# Patient Record
Sex: Female | Born: 1950 | Race: White | Hispanic: No | Marital: Married | Smoking: Former smoker
Health system: Southern US, Community
[De-identification: ages and names within clinical notes are randomized; demographics above are authoritative.]

## PROBLEM LIST (undated history)

## (undated) DIAGNOSIS — Z87442 Personal history of urinary calculi: Secondary | ICD-10-CM

## (undated) DIAGNOSIS — R3129 Other microscopic hematuria: Secondary | ICD-10-CM

## (undated) DIAGNOSIS — I10 Essential (primary) hypertension: Secondary | ICD-10-CM

## (undated) DIAGNOSIS — E669 Obesity, unspecified: Secondary | ICD-10-CM

## (undated) DIAGNOSIS — N200 Calculus of kidney: Secondary | ICD-10-CM

## (undated) DIAGNOSIS — Z8739 Personal history of other diseases of the musculoskeletal system and connective tissue: Secondary | ICD-10-CM

## (undated) DIAGNOSIS — D219 Benign neoplasm of connective and other soft tissue, unspecified: Secondary | ICD-10-CM

## (undated) DIAGNOSIS — E119 Type 2 diabetes mellitus without complications: Secondary | ICD-10-CM

## (undated) DIAGNOSIS — H40059 Ocular hypertension, unspecified eye: Secondary | ICD-10-CM

## (undated) DIAGNOSIS — R011 Cardiac murmur, unspecified: Secondary | ICD-10-CM

## (undated) DIAGNOSIS — N920 Excessive and frequent menstruation with regular cycle: Secondary | ICD-10-CM

## (undated) DIAGNOSIS — E785 Hyperlipidemia, unspecified: Secondary | ICD-10-CM

## (undated) DIAGNOSIS — N189 Chronic kidney disease, unspecified: Secondary | ICD-10-CM

## (undated) HISTORY — DX: Other microscopic hematuria: R31.29

## (undated) HISTORY — DX: Excessive and frequent menstruation with regular cycle: N92.0

## (undated) HISTORY — DX: Essential (primary) hypertension: I10

## (undated) HISTORY — DX: Obesity, unspecified: E66.9

## (undated) HISTORY — PX: TONSILLECTOMY: SUR1361

## (undated) HISTORY — PX: DILATION AND CURETTAGE OF UTERUS: SHX78

## (undated) HISTORY — DX: Calculus of kidney: N20.0

## (undated) HISTORY — DX: Type 2 diabetes mellitus without complications: E11.9

## (undated) HISTORY — PX: ENDOMETRIAL BIOPSY: SHX622

## (undated) HISTORY — DX: Chronic kidney disease, unspecified: N18.9

## (undated) HISTORY — PX: URETEROSCOPY WITH HOLMIUM LASER LITHOTRIPSY: SHX6645

## (undated) HISTORY — DX: Ocular hypertension, unspecified eye: H40.059

## (undated) HISTORY — DX: Hyperlipidemia, unspecified: E78.5

## (undated) HISTORY — DX: Benign neoplasm of connective and other soft tissue, unspecified: D21.9

## (undated) HISTORY — DX: Personal history of other diseases of the musculoskeletal system and connective tissue: Z87.39

---

## 1998-01-14 HISTORY — PX: HYSTEROSCOPY WITH D & C: SHX1775

## 2005-05-15 ENCOUNTER — Ambulatory Visit: Payer: Self-pay | Admitting: Internal Medicine

## 2006-06-27 ENCOUNTER — Ambulatory Visit: Payer: Self-pay | Admitting: Obstetrics and Gynecology

## 2007-07-02 ENCOUNTER — Ambulatory Visit: Payer: Self-pay

## 2007-10-07 ENCOUNTER — Ambulatory Visit: Payer: Self-pay | Admitting: Physician Assistant

## 2007-12-12 HISTORY — PX: CARDIAC CATHETERIZATION: SHX172

## 2008-07-02 ENCOUNTER — Ambulatory Visit: Payer: Self-pay

## 2009-04-13 ENCOUNTER — Ambulatory Visit: Payer: Self-pay | Admitting: Cardiology

## 2009-07-06 ENCOUNTER — Ambulatory Visit: Payer: Self-pay

## 2009-07-30 ENCOUNTER — Ambulatory Visit: Payer: Self-pay | Admitting: Unknown Physician Specialty

## 2010-07-07 ENCOUNTER — Ambulatory Visit: Payer: Self-pay

## 2010-07-18 ENCOUNTER — Ambulatory Visit: Payer: Self-pay | Admitting: Internal Medicine

## 2010-08-12 ENCOUNTER — Ambulatory Visit: Payer: Self-pay | Admitting: Internal Medicine

## 2010-12-11 DIAGNOSIS — N189 Chronic kidney disease, unspecified: Secondary | ICD-10-CM

## 2010-12-11 HISTORY — DX: Chronic kidney disease, unspecified: N18.9

## 2011-05-12 ENCOUNTER — Ambulatory Visit: Payer: Self-pay | Admitting: Nephrology

## 2011-07-24 ENCOUNTER — Ambulatory Visit: Payer: Self-pay | Admitting: Nephrology

## 2011-07-25 ENCOUNTER — Ambulatory Visit: Payer: Self-pay | Admitting: Internal Medicine

## 2011-08-12 ENCOUNTER — Ambulatory Visit: Payer: Self-pay | Admitting: Nephrology

## 2012-07-31 ENCOUNTER — Ambulatory Visit: Payer: Self-pay

## 2012-09-09 ENCOUNTER — Ambulatory Visit: Payer: Self-pay | Admitting: Ophthalmology

## 2012-09-10 HISTORY — PX: CATARACT EXTRACTION: SUR2

## 2012-09-18 ENCOUNTER — Ambulatory Visit: Payer: Self-pay | Admitting: Ophthalmology

## 2013-08-13 ENCOUNTER — Ambulatory Visit: Payer: Self-pay | Admitting: Internal Medicine

## 2013-12-16 ENCOUNTER — Ambulatory Visit: Payer: Self-pay | Admitting: Internal Medicine

## 2014-08-18 ENCOUNTER — Ambulatory Visit: Payer: Self-pay | Admitting: Internal Medicine

## 2014-09-11 DIAGNOSIS — E7800 Pure hypercholesterolemia, unspecified: Secondary | ICD-10-CM | POA: Insufficient documentation

## 2014-10-15 DIAGNOSIS — Z86010 Personal history of colonic polyps: Secondary | ICD-10-CM | POA: Insufficient documentation

## 2014-12-11 HISTORY — PX: COLONOSCOPY: SHX174

## 2014-12-24 ENCOUNTER — Ambulatory Visit: Payer: Self-pay | Admitting: Unknown Physician Specialty

## 2015-03-10 ENCOUNTER — Encounter: Payer: Self-pay | Admitting: *Deleted

## 2015-03-30 NOTE — Op Note (Signed)
PATIENT NAME:  Cheryl Kirk, Cheryl Kirk MR#:  [id number] DATE OF BIRTH:  1951/02/16  DATE OF PROCEDURE:  09/18/2012  PREOPERATIVE DIAGNOSIS:  Senile cataract right eye.  POSTOPERATIVE DIAGNOSIS:  Senile cataract right eye.  PROCEDURE:  Phacoemulsification with posterior chamber intraocular lens implantation of the right eye.  LENS:  ZCB00 19.5-diopter posterior chamber intraocular lens.  ULTRASOUND TIME:  13% of 1 minute, 31 seconds. CDE 11.9.  SURGEON:  Mali Victorious Cosio, MD  ANESTHESIA:  Topical with tetracaine drops and 2% Xylocaine jelly.  COMPLICATIONS:  None.  DESCRIPTION OF PROCEDURE:  The patient was identified in the holding room and transported to the operating room and placed in the supine position under the operating microscope.  The right eye was identified as the operative eye and it was prepped and draped in the usual sterile ophthalmic fashion.  A 1 millimeter clear-corneal paracentesis was made at the 12:00 position.  The anterior chamber was filled with Viscoat viscoelastic.  A 2.4 millimeter keratome was used to make a near-clear corneal incision at the 9:00 position.  A curvilinear capsulorrhexis was made with a cystotome and capsulorrhexis forceps.  Balanced salt solution was used to hydrodissect and hydrodelineate the nucleus.  Phacoemulsification was then used in horizontal chopping fashion to remove the lens nucleus and epinucleus.  The remaining cortex was then removed using the irrigation and aspiration handpiece. Provisc was then placed into the capsular bag to distend it for lens placement.  An ZCB00 19.5-diopter lens was then injected into the capsular bag.  The remaining viscoelastic was aspirated.  Wounds were hydrated with balanced salt solution.  The anterior chamber was inflated to a physiologic pressure with balanced salt solution.  Miostat was placed into the anterior chamber to constrict the pupil.  No wound leaks were noted.  Topical Vigamox drops and Maxitrol  ointment were applied to the eye.  The patient was taken to the recovery room in stable condition without complications of anesthesia or surgery.  ____________________________ Wyonia Hough, MD crb:cms D: 09/18/2012 13:21:08 ET T: 09/18/2012 13:33:10 ET JOB#: [id number]  cc: Wyonia Hough, MD, <Dictator>  Leandrew Koyanagi MD ELECTRONICALLY SIGNED 09/18/2012 15:42

## 2015-04-20 ENCOUNTER — Other Ambulatory Visit: Payer: Self-pay

## 2015-04-20 NOTE — Patient Instructions (Addendum)
Serving Sizes What we call a serving size today is larger than it was in the past. A 1950s fast-food burger contained little more than 1 oz of meat, and a soft drink was 8 oz (1 cup). Today, a "quarter pounder" burger is at least 4 times that amount, and a 32 or 64 oz drink is not uncommon. A possible guide for eating when trying to lose weight is to eat about half as much as you normally do. Some estimates of serving sizes are:  1 Dairy serving:Individual container of yogurt (8 oz) or piece of cheese the size of your thumb (1 oz).  1 Grain serving: 1 slice of bread or  cup pasta.  1 Meat serving: The size of a deck of cards (3 oz).  1 Fruit serving: cup canned fruit or 1 medium fruit.  1 Vegetable serving:  cup of cooked or canned vegetables.  1 Fat serving:The size of 4 stacked dimes. Experts suggest spending 1 or 2 days measuring food portions you commonly eat. This will give you better practice at estimating serving sizes, and will also show whether you are eating an appropriate amount of food to meet your weight goals. If you find that you are eating more than you thought, try measuring your food for a few days so you can "reprogram" yourself to learn what makes a healthy portion for you. SUGGESTIONS FOR CONTROL  In restaurants, share entrees, or ask the waiter to put half the entre in a box or bag before you even touch it.  Order lunch-sized portions. Many restaurants serve 4 to 6 oz of meat at lunch, compared with 8 to 10 oz at dinner.  Split dessert or skip it all together. Have a piece of fruit when you get home.  At home, use smaller plates and bowls. It will look as if you are eating more.  Plate your food in the kitchen rather than serving it "family style" at the table.  Wait 20 to 30 minutes before taking seconds. This is how long it takes your brain to recognize that you are full.  Check food labels for serving sizes. Eat 1 serving only.  Use measuring cups and  spoons to see proper serving sizes.  Buy smaller packages of candy, popcorn, and snacks.  Avoid eating directly out of the bag or carton.  While eating half as much, exercise twice as much. Park further away from the mall, take the stairs instead of the escalator, and walk around your block. Losing weight is a slow, difficult process. It takes long-lasting lifestyle changes. You can make gradual changes over time so they become habits. Look to friends and family to support the healthy changes you are making. Avoid fad diets since they are often only temporary weight loss solutions. Document Released: 08/26/2003 Document Revised: 02/19/2012 Document Reviewed: 02/24/2014 ExitCare Patient Information 2015 [employer]. This information is not intended to replace advice given to you by your health care provider. Make sure you discuss any questions you have with your health care provider.  

## 2015-04-20 NOTE — Patient Outreach (Signed)
[facility]) Care Management  [place]  04/20/2015   Cheryl Kirk 09-14-1951 [id number]  Subjective: Patient states she has lost weight using the Lose It app on her phone.  She has increased her exercise and tells me she had to stop one of her BP meds (Metoprolol)in the evening and is only taking Januvia 2x/week because she's lost 16lbs and her blood sugar and blood pressure were going to low.  Objective: Blood sugars averaging 68-120mg /dl fasting and BP in the 726'O systolic as compared to higher numbers in December when she was only averaging 7000 steps per day.  She has stopped 1 of the 2 doses of Metoprolol and takes the Januvia sporadically. She is now walking 12-13,000 steps per day and is exercising on top of that.   Current Medications:  Current Outpatient Prescriptions  Medication Sig Dispense Refill  . ascorbic acid (VITAMIN C) 250 MG CHEW Chew 500 mg by mouth daily.    . calcium gluconate 500 MG tablet Take 1 tablet by mouth daily.    . cholecalciferol (VITAMIN D) 400 UNITS TABS tablet Take 400 Units by mouth daily. Vitamin d3    . glipiZIDE (GLUCOTROL XL) 10 MG 24 hr tablet Take 1 tablet by mouth daily. qam    . metFORMIN (GLUCOPHAGE) 1000 MG tablet Take 1,000 mg by mouth daily.    . metoprolol succinate (TOPROL-XL) 50 MG 24 hr tablet TAKE ONE TABLET BY MOUTH 2 TIMES A DAY    . olmesartan (BENICAR) 40 MG tablet Take 40 mg by mouth daily.    . Pitavastatin Calcium 2 MG TABS Take 1 tablet by mouth daily. qam    . sitaGLIPtin (JANUVIA) 100 MG tablet Take 100 mg by mouth daily.     No current facility-administered medications for this visit.    Functional Status:  In your present state of health, do you have any difficulty performing the following activities: 04/20/2015  Hearing? N  Vision? N  Difficulty concentrating or making decisions? N  Walking or climbing stairs? N  Dressing or bathing? N  Doing errands, shopping? N    Fall/Depression  Screening: PHQ 2/9 Scores 04/20/2015  PHQ - 2 Score 0    Assessment: Improved blood sugar and blood pressures as a result of the Lose it App that allows Cheryl Kirk to track her dietary intake.  She's thrilled with the 16 lb weight loss.  She's decreased carbs, cheese, and snacks.  Plan: Continue to measure food, wear the Jawbone, track BP, weight and blood sugars.  HgA1C- May 11, 2015 Colonoscopy- January 2016 Dilated eye scheduled May 18, 2015 Dr. Doy Hutching April 2016 Dr. Holley Raring- February 2016, scheduled May 18, 2015 Dr. Gabriel Carina- scheduled May 18, 2015 (q 6 months) Dentist- April 2016 [facility]- November 2016   [place] Problem One        Patient Outreach from 04/20/2015 in [place] Problem One  Potential for elevated A1C greater than 7%   Care Plan for Problem One  Active   [facility] Long Term Goal (31-90 days)  Maintain A1C less than 7% each time it is checked   [facility] Long Term Goal Start Date  04/20/15   Interventions for Problem One Long Term Goal  - continue to document dietary intake on Lose it   -continue to monitor exercise aiming for 10,000 steps/day  - continue to monitor Bp, weight and BP daily      Gentry Fitz, RN,  BA, [place], [place] Direct Dial:  [phone]  Fax:  [phone] E-mail: [email or web address] [address] [place], [place]  [address]

## 2015-08-04 ENCOUNTER — Other Ambulatory Visit: Payer: Self-pay | Admitting: Internal Medicine

## 2015-08-04 DIAGNOSIS — Z1231 Encounter for screening mammogram for malignant neoplasm of breast: Secondary | ICD-10-CM

## 2015-08-24 ENCOUNTER — Other Ambulatory Visit: Payer: Self-pay | Admitting: Internal Medicine

## 2015-08-24 ENCOUNTER — Ambulatory Visit
Admission: RE | Admit: 2015-08-24 | Discharge: 2015-08-24 | Disposition: A | Discharge status: Home / Self Care | Payer: 59 | Source: Ambulatory Visit | Attending: Internal Medicine | Admitting: Internal Medicine

## 2015-08-24 DIAGNOSIS — Z1231 Encounter for screening mammogram for malignant neoplasm of breast: Secondary | ICD-10-CM | POA: Diagnosis present

## 2015-10-26 ENCOUNTER — Ambulatory Visit: Payer: 59

## 2015-11-08 ENCOUNTER — Other Ambulatory Visit: Payer: Self-pay

## 2015-11-08 VITALS — BP 114/64 | Ht 63.0 in | Wt 173.4 lb

## 2015-11-08 DIAGNOSIS — E119 Type 2 diabetes mellitus without complications: Secondary | ICD-10-CM

## 2015-11-08 NOTE — Patient Outreach (Signed)
[place] [facility]) Care Management  [place]  11/08/2015   Cheryl Kirk July 29, 1951 [id number]  Subjective: Patient was in for her Link to Wellness visit. She has no complaints.    Objective: Blood sugars 90-142mg /dl over the past month. She continues to check her blood sugar every day and document her BP and her activity.  She continues to use the Lose It app on her phone and has lost about 14lbs in 2016.  She is following a low carb diet.  She has adding Turmeric to her med list.  Filed Vitals:   11/08/15 1633  BP: 114/64  Height: 1.6 m (5\' 3" )  Weight: 173 lb 6.4 oz (78.654 kg)     Current Medications:  Current Outpatient Prescriptions  Medication Sig Dispense Refill  . ascorbic acid (VITAMIN C) 250 MG CHEW Chew 500 mg by mouth daily.    . calcium gluconate 500 MG tablet Take 1 tablet by mouth daily.    . cholecalciferol (VITAMIN D) 400 UNITS TABS tablet Take 400 Units by mouth daily. Vitamin d3    . metFORMIN (GLUCOPHAGE) 1000 MG tablet Take 1,000 mg by mouth daily.    . metoprolol succinate (TOPROL-XL) 50 MG 24 hr tablet TAKE ONE TABLET BY MOUTH 2 TIMES A DAY    . Misc Natural Products (TURMERIC CURCUMIN) CAPS Take 1,500 mg by mouth daily. With Bioperine    . olmesartan (BENICAR) 40 MG tablet Take 40 mg by mouth daily.    . Pitavastatin Calcium 2 MG TABS Take 1 tablet by mouth daily. qam    . sitaGLIPtin (JANUVIA) 100 MG tablet Take 100 mg by mouth daily. Only takes 2-3 times per week- MD aware    . glipiZIDE (GLUCOTROL XL) 10 MG 24 hr tablet Take 1 tablet by mouth daily. qam     No current facility-administered medications for this visit.    Functional Status:  In your present state of health, do you have any difficulty performing the following activities: 11/08/2015 04/20/2015  Hearing? N N  Vision? N N  Difficulty concentrating or making decisions? N N  Walking or climbing stairs? N N  Dressing or bathing? N N  Doing errands, shopping? N N     Fall/Depression Screening: PHQ 2/9 Scores 11/08/2015 11/08/2015 04/20/2015  PHQ - 2 Score 0 0 0    Assessment: Well controlled blood sugars (A1C 6.5%) and BP ~ 115/63).  Follow up appointments set for Dr. Gabriel Carina.  She has completed her dilated eye, dental, PAP and flu vaccine.   Plan: Continue to check daily BP and blood sugars and daily activity.  Follow up with MDs on a regular basis.    Follow up with our team in 6 months.    Gentry Fitz, RN, BA, [place], [place] Direct Dial:  [phone]  Fax:  [phone] E-mail: [email or web address] [address] [place], [place]  [address]

## 2016-02-01 DIAGNOSIS — E119 Type 2 diabetes mellitus without complications: Secondary | ICD-10-CM | POA: Diagnosis not present

## 2016-02-01 DIAGNOSIS — I10 Essential (primary) hypertension: Secondary | ICD-10-CM | POA: Diagnosis not present

## 2016-02-01 DIAGNOSIS — N183 Chronic kidney disease, stage 3 (moderate): Secondary | ICD-10-CM | POA: Diagnosis not present

## 2016-02-07 ENCOUNTER — Other Ambulatory Visit: Payer: Self-pay

## 2016-02-07 DIAGNOSIS — E119 Type 2 diabetes mellitus without complications: Secondary | ICD-10-CM

## 2016-02-07 NOTE — Patient Outreach (Signed)
[facility]) Care Management  [place]  02/07/2016   Cheryl Kirk August 22, 1951 [id number]  Subjective: Cheryl Kirk visit- Blood sugars ideal 79-168mg /dl over the past 2 weeks- continues to check sugars daily, monitor BP, eat healthy low sodium meals and engage in regular exercise. Reports GFR has improved.  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  She continues to carry [facility] She has no complaints.  Completes dilated eye in January and dental today.  Objective:  Filed Vitals:   02/07/16 1608  BP: 101/52  Height: 1.6 m (5\' 3" )  Weight: 172 lb 3.2 oz (78.109 kg)     Current Medications:  Current Outpatient Prescriptions  Medication Sig Dispense Refill  . ascorbic acid (VITAMIN C) 250 MG CHEW Chew 500 mg by mouth daily.    . calcium gluconate 500 MG tablet Take 1 tablet by mouth daily.    . cholecalciferol (VITAMIN D) 400 UNITS TABS tablet Take 400 Units by mouth daily. Vitamin d3    . glipiZIDE (GLUCOTROL) 10 MG tablet Take 10 mg by mouth daily before breakfast.    . latanoprost (XALATAN) 0.005 % ophthalmic solution Place 1 drop into both eyes at bedtime.    . metFORMIN (GLUCOPHAGE) 1000 MG tablet Take 1,000 mg by mouth daily.    . metoprolol succinate (TOPROL-XL) 50 MG 24 hr tablet TAKE ONE TABLET BY MOUTH 2 TIMES A DAY    . Misc Natural Products (TURMERIC CURCUMIN) CAPS Take 1,500 mg by mouth daily. With Bioperine    . olmesartan (BENICAR) 40 MG tablet Take 40 mg by mouth daily.    . Pitavastatin Calcium 2 MG TABS Take 1 tablet by mouth daily. qam    . sitaGLIPtin (JANUVIA) 100 MG tablet Take 100 mg by mouth daily. Only takes 2-3 times per week- MD aware    . glipiZIDE (GLUCOTROL XL) 10 MG 24 hr tablet Take 1 tablet by mouth daily. qam     No current facility-administered medications for this visit.    Functional Status:  In your present  state of health, do you have any difficulty performing the following activities: 02/07/2016 11/08/2015  Hearing? N N  Vision? N N  Difficulty concentrating or making decisions? N N  Walking or climbing stairs? N N  Dressing or bathing? N N  Doing errands, shopping? N N    Fall/Depression Screening: PHQ 2/9 Scores 02/07/2016 02/07/2016 11/08/2015 11/08/2015 04/20/2015  PHQ - 2 Score 0 0 0 0 0    Assessment: Well managed diabetes- A1C with Dr. Gabriel Carina 6.4%. She is going to start the [place] and will continue to exercise daily despite retirement.  Plan: I have asked Cheryl Kirk to complete a HCPOA.  [facility] CM Care Plan Problem One        Most Recent Value   Care Plan Problem One  Potential for elevated A1C greater than 7%   Role Documenting the Problem One  Care Management Coordinator   Care Plan for Problem One  Active   [facility] Long Term Goal (31-90 days)  Maintain A1C less than 7% each time it is checked   [facility] Long Term Goal Start Date  02/07/16   Interventions for Problem One Long Term Goal  - continue to document dietary intake on Lose it   -continue to monitor exercise aiming for 10,000 steps   - continue to monitor  Bp, weight and BP daily

## 2016-03-14 DIAGNOSIS — I8312 Varicose veins of left lower extremity with inflammation: Secondary | ICD-10-CM | POA: Diagnosis not present

## 2016-03-14 DIAGNOSIS — I8311 Varicose veins of right lower extremity with inflammation: Secondary | ICD-10-CM | POA: Diagnosis not present

## 2016-03-14 DIAGNOSIS — X32XXXA Exposure to sunlight, initial encounter: Secondary | ICD-10-CM | POA: Diagnosis not present

## 2016-03-14 DIAGNOSIS — L821 Other seborrheic keratosis: Secondary | ICD-10-CM | POA: Diagnosis not present

## 2016-03-14 DIAGNOSIS — L570 Actinic keratosis: Secondary | ICD-10-CM | POA: Diagnosis not present

## 2016-03-29 DIAGNOSIS — N289 Disorder of kidney and ureter, unspecified: Secondary | ICD-10-CM | POA: Diagnosis not present

## 2016-03-29 DIAGNOSIS — M25551 Pain in right hip: Secondary | ICD-10-CM | POA: Diagnosis not present

## 2016-03-29 DIAGNOSIS — D509 Iron deficiency anemia, unspecified: Secondary | ICD-10-CM | POA: Diagnosis not present

## 2016-03-29 DIAGNOSIS — M533 Sacrococcygeal disorders, not elsewhere classified: Secondary | ICD-10-CM | POA: Diagnosis not present

## 2016-03-29 DIAGNOSIS — M159 Polyosteoarthritis, unspecified: Secondary | ICD-10-CM | POA: Insufficient documentation

## 2016-03-29 DIAGNOSIS — E7800 Pure hypercholesterolemia, unspecified: Secondary | ICD-10-CM | POA: Diagnosis not present

## 2016-03-29 DIAGNOSIS — I10 Essential (primary) hypertension: Secondary | ICD-10-CM | POA: Diagnosis not present

## 2016-03-29 DIAGNOSIS — M25552 Pain in left hip: Secondary | ICD-10-CM | POA: Diagnosis not present

## 2016-05-15 DIAGNOSIS — H40003 Preglaucoma, unspecified, bilateral: Secondary | ICD-10-CM | POA: Diagnosis not present

## 2016-05-17 DIAGNOSIS — Z79899 Other long term (current) drug therapy: Secondary | ICD-10-CM | POA: Diagnosis not present

## 2016-05-17 DIAGNOSIS — E119 Type 2 diabetes mellitus without complications: Secondary | ICD-10-CM | POA: Diagnosis not present

## 2016-05-17 DIAGNOSIS — E7800 Pure hypercholesterolemia, unspecified: Secondary | ICD-10-CM | POA: Diagnosis not present

## 2016-05-17 DIAGNOSIS — E559 Vitamin D deficiency, unspecified: Secondary | ICD-10-CM | POA: Diagnosis not present

## 2016-05-17 DIAGNOSIS — I10 Essential (primary) hypertension: Secondary | ICD-10-CM | POA: Diagnosis not present

## 2016-05-24 DIAGNOSIS — R5382 Chronic fatigue, unspecified: Secondary | ICD-10-CM | POA: Diagnosis not present

## 2016-05-24 DIAGNOSIS — N183 Chronic kidney disease, stage 3 (moderate): Secondary | ICD-10-CM | POA: Diagnosis not present

## 2016-05-24 DIAGNOSIS — R3129 Other microscopic hematuria: Secondary | ICD-10-CM | POA: Diagnosis not present

## 2016-05-24 DIAGNOSIS — E1122 Type 2 diabetes mellitus with diabetic chronic kidney disease: Secondary | ICD-10-CM | POA: Diagnosis not present

## 2016-06-05 DIAGNOSIS — H40053 Ocular hypertension, bilateral: Secondary | ICD-10-CM | POA: Diagnosis not present

## 2016-07-05 ENCOUNTER — Other Ambulatory Visit: Payer: Self-pay

## 2016-07-05 VITALS — BP 98/60 | Ht 63.0 in | Wt 178.4 lb

## 2016-07-05 NOTE — Patient Outreach (Signed)
[facility]) Care Management  [place]  07/05/2016   Cheryl Kirk [birth year] [id number]  Subjective: Met with Cheryl Kirk today for her Link to Wellness diabetes visit.  She has recently seen endocrinologist, family doctor and had a dilated eye exam.  She has no complaints.  Her last A1C was up slightly- she has had her Metfromin increased and now takes 1064m in the am and 5076min the pm.  She continues on Glucotrol, and Januvia. She has a very occasional episode of hypoglycemia (at work if she doesn't get a snack).  She is eating 1400 calories per day, rarely eats out , monitors her BP and steps daily.  She aims for exercise 4-5 times per week.  Objective:  Vitals:   07/05/16 1537  BP: 98/60  Weight: 178 lb 6.4 oz (80.9 kg)  Height: 1.6 m ('5\' 3"' )     Encounter Medications:  Outpatient Encounter Prescriptions as of 07/05/2016  Medication Sig Note  . ascorbic acid (VITAMIN C) 250 MG CHEW Chew 500 mg by mouth daily.   . Ascorbic Acid (VITAMIN C) 500 MG CAPS Take 1 tablet by mouth daily.   . calcium gluconate 500 MG tablet Take 1 tablet by mouth daily.   . cholecalciferol (VITAMIN D) 400 UNITS TABS tablet Take 400 Units by mouth daily. Vitamin d3   . ferrous sulfate 325 (65 FE) MG tablet Take 325 mg by mouth daily with breakfast.   . folic acid (FOLVITE) 40240CG tablet Take 400 mcg by mouth daily.   . Marland KitchenlipiZIDE (GLUCOTROL) 10 MG tablet Take 10 mg by mouth daily before breakfast.   . latanoprost (XALATAN) 0.005 % ophthalmic solution Place 1 drop into both eyes at bedtime.   . metFORMIN (GLUCOPHAGE) 1000 MG tablet Take 500 mg by mouth 2 (two) times daily. 2 in am and 1 in pm   . metoprolol succinate (TOPROL-XL) 50 MG 24 hr tablet TAKE ONE TABLET BY MOUTH 2 TIMES A DAY 11/08/2015: Generally only taking one a day  . olmesartan (BENICAR) 40 MG tablet Take 40 mg by mouth daily. 04/20/2015: [facility] BY [facility]  ONCE A DAY  . Pitavastatin Calcium 2 MG TABS Take 1 tablet by mouth daily. qam 04/20/2015: [place] [facility] [place] by mouth every night  . sitaGLIPtin (JANUVIA) 100 MG tablet Take 100 mg by mouth daily. Only takes 2-3 times per week- MD aware   . glipiZIDE (GLUCOTROL XL) 10 MG 24 hr tablet Take 1 tablet by mouth daily. qam 11/08/2015: taking  . Misc Natural Products (TURMERIC CURCUMIN) CAPS Take 1,500 mg by mouth daily. With Bioperine    No facility-administered encounter medications on file as of 07/05/2016.     Functional Status:  In your present state of health, do you have any difficulty performing the following activities: 02/07/2016 11/08/2015  Hearing? N N  Vision? N N  Difficulty concentrating or making decisions? N N  Walking or climbing stairs? N N  Dressing or bathing? N N  Doing errands, shopping? N N  Some recent data might be hidden    Fall/Depression Screening: PHQ 2/9 Scores 02/07/2016 02/07/2016 11/08/2015 11/08/2015 04/20/2015  PHQ - 2 Score 0 0 0 0 0    Assessment: Very engaged patient.  She has follow up appointments with family doctor, endocrinologist and nephrologist.   Plan:  [place]   Flowsheet Row  Most Recent Value  Care Plan Problem One  Potential for elevated A1C greater than 7%  Role Documenting the Problem One  Care Management [place] for Problem One  Active  [facility] Long Term Goal (31-90 days)  Maintain A1C less than 7% each time it is checked  [facility] Long Term Goal Start Date  07/05/16  [facility] Long Term Goal Met Date  -- [A1C 6.7% july 2017]  Interventions for Problem One Long Term Goal  Praised health habits- eating, diet monitoring, meds, exercise, healthcare follow up

## 2016-08-16 ENCOUNTER — Other Ambulatory Visit: Payer: Self-pay | Admitting: Internal Medicine

## 2016-08-16 DIAGNOSIS — Z1231 Encounter for screening mammogram for malignant neoplasm of breast: Secondary | ICD-10-CM

## 2016-08-24 DIAGNOSIS — E119 Type 2 diabetes mellitus without complications: Secondary | ICD-10-CM | POA: Diagnosis not present

## 2016-08-24 DIAGNOSIS — I10 Essential (primary) hypertension: Secondary | ICD-10-CM | POA: Diagnosis not present

## 2016-08-24 DIAGNOSIS — N183 Chronic kidney disease, stage 3 (moderate): Secondary | ICD-10-CM | POA: Diagnosis not present

## 2016-09-06 ENCOUNTER — Ambulatory Visit
Admission: RE | Admit: 2016-09-06 | Discharge: 2016-09-06 | Disposition: A | Discharge status: Home / Self Care | Payer: 59 | Source: Ambulatory Visit | Attending: Internal Medicine | Admitting: Internal Medicine

## 2016-09-06 DIAGNOSIS — Z1231 Encounter for screening mammogram for malignant neoplasm of breast: Secondary | ICD-10-CM | POA: Diagnosis not present

## 2016-09-19 ENCOUNTER — Ambulatory Visit (INDEPENDENT_AMBULATORY_CARE_PROVIDER_SITE_OTHER): Payer: 59 | Admitting: Podiatry

## 2016-09-19 ENCOUNTER — Ambulatory Visit (INDEPENDENT_AMBULATORY_CARE_PROVIDER_SITE_OTHER): Payer: 59

## 2016-09-19 ENCOUNTER — Encounter: Payer: Self-pay | Admitting: Podiatry

## 2016-09-19 VITALS — BP 105/61 | HR 78 | Resp 16 | Ht 63.0 in | Wt 172.0 lb

## 2016-09-19 DIAGNOSIS — M79671 Pain in right foot: Secondary | ICD-10-CM

## 2016-09-19 DIAGNOSIS — M79672 Pain in left foot: Secondary | ICD-10-CM | POA: Diagnosis not present

## 2016-09-19 DIAGNOSIS — L84 Corns and callosities: Secondary | ICD-10-CM | POA: Diagnosis not present

## 2016-09-19 DIAGNOSIS — L851 Acquired keratosis [keratoderma] palmaris et plantaris: Secondary | ICD-10-CM | POA: Diagnosis not present

## 2016-09-19 NOTE — Patient Instructions (Signed)
Corns and Calluses Corns are small areas of thickened skin that occur on the top, sides, or tip of a toe. They contain a cone-shaped core with a point that can press on a nerve below. This causes pain. Calluses are areas of thickened skin that can occur anywhere on the body including hands, fingers, palms, soles of the feet, and heels.Calluses are usually larger than corns.  CAUSES  Corns and calluses are caused by rubbing (friction) or pressure, such as from shoes that are too tight or do not fit properly.  RISK FACTORS Corns are more likely to develop in people who have toe deformities, such as hammer toes. Since calluses can occur with friction to any area of the skin, calluses are more likely to develop in people who:   Work with their hands.  Wear shoes that fit poorly, shoes that are too tight, or shoes that are high-heeled.  Have toes deformities. SYMPTOMS Symptoms of a corn or callus include:  A hard growth on the skin.   Pain or tenderness under the skin.   Redness and swelling.   Increased discomfort while wearing tight-fitting shoes. DIAGNOSIS  Corns and calluses may be diagnosed with a medical history and physical exam.  TREATMENT  Corns and calluses may be treated with:  Removing the cause of the friction or pressure. This may include:  Changing your shoes.  Wearing shoe inserts (orthotics) or other protective layers in your shoes, such as a corn pad.  Wearing gloves.  Medicines to help soften skin in the hardened, thickened areas.  Reducing the size of the corn or callus by removing the dead layers of skin.  Antibiotic medicines to treat infection.  Surgery, if a toe deformity is the cause. HOME CARE INSTRUCTIONS   Take medicines only as directed by your health care provider.  If you were prescribed an antibiotic, finish all of it even if you start to feel better.  Wear shoes that fit well. Avoid wearing high-heeled shoes and shoes that are too tight  or too loose.  Wear any padding, protective layers, gloves, or orthotics as directed by your health care provider.  Soak your hands or feet and then use a file or pumice stone to soften your corn or callus. Do this as directed by your health care provider.  Check your corn or callus every day for signs of infection. Watch for:  Redness, swelling, or pain.  Fluid, blood, or pus. SEEK MEDICAL CARE IF:   Your symptoms do not improve with treatment.  You have increased redness, swelling, or pain at the site of your corn or callus.  You have fluid, blood, or pus coming from your corn or callus.  You have new symptoms.   This information is not intended to replace advice given to you by your health care provider. Make sure you discuss any questions you have with your health care provider.   Document Released: 09/02/2004 Document Revised: 04/13/2015 Document Reviewed: 11/23/2014 Elsevier Interactive Patient Education 2016 Elsevier Inc.  

## 2016-09-19 NOTE — Progress Notes (Signed)
   Subjective:    Patient ID: Cheryl Kirk, female    DOB: [birth year], 65 y.o.   MRN: [id number]  HPI    Review of Systems  Hematological: Bruises/bleeds easily.  All other systems reviewed and are negative.      Objective:   Physical Exam        Assessment & Plan:

## 2016-09-20 NOTE — Progress Notes (Signed)
Patient ID: Cheryl Kirk, female   DOB: 1951-10-11, 65 y.o.   MRN: [id number] Subjective: Patient presents to the office today for chief complaint of painful callus lesions of the feet. Patient states that the pain is ongoing and is affecting their ability to ambulate without pain. Patient presents today for further treatment and evaluation. Patient also presents with pain and tenderness to the fifth metatarsal tuberosity left foot.  Objective:  Physical Exam General: Alert and oriented x3 in no acute distress  Dermatology: Hyperkeratotic lesion present on the plantar aspect of the fourth and fifth MPJs bilaterally. Pain on palpation with a central nucleated core noted.  Skin is warm, dry and supple bilateral lower extremities. Negative for open lesions or macerations.  Vascular: Palpable pedal pulses bilaterally. No edema or erythema noted. Capillary refill within normal limits.  Neurological: Epicritic and protective threshold grossly intact bilaterally.   Musculoskeletal Exam: Pain on palpation at the keratotic lesion noted. Range of motion within normal limits bilateral. Muscle strength 5/5 in all groups bilateral.  Radiographic exam: Enlargement of the fifth metatarsal tuberosity to the left foot noted. Possibly due to history of trauma. All other joints in osseous structures within normal limits.  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  #3 Treated area(s) with Salinocaine and dressed with light dressing. #4 recommend wider shoe gear and conservative modalities to alleviate fifth metatarsal tuberosity symptoms. #5 metatarsal pads dispensed for the patient to try. If the patient likes them than recommend custom molded orthotics with  metatarsal pads built in #6 Patient is to return to the clinic PRN.   Edrick Kins, [place]

## 2016-09-27 DIAGNOSIS — Z124 Encounter for screening for malignant neoplasm of cervix: Secondary | ICD-10-CM | POA: Diagnosis not present

## 2016-09-27 DIAGNOSIS — Z01419 Encounter for gynecological examination (general) (routine) without abnormal findings: Secondary | ICD-10-CM | POA: Diagnosis not present

## 2016-09-28 DIAGNOSIS — I10 Essential (primary) hypertension: Secondary | ICD-10-CM | POA: Diagnosis not present

## 2016-09-28 DIAGNOSIS — Z23 Encounter for immunization: Secondary | ICD-10-CM | POA: Diagnosis not present

## 2016-09-28 DIAGNOSIS — D509 Iron deficiency anemia, unspecified: Secondary | ICD-10-CM | POA: Diagnosis not present

## 2016-09-28 DIAGNOSIS — E7800 Pure hypercholesterolemia, unspecified: Secondary | ICD-10-CM | POA: Diagnosis not present

## 2016-09-28 DIAGNOSIS — Z0000 Encounter for general adult medical examination without abnormal findings: Secondary | ICD-10-CM | POA: Diagnosis not present

## 2016-10-26 ENCOUNTER — Ambulatory Visit: Payer: 59 | Admitting: Urology

## 2016-10-26 ENCOUNTER — Encounter: Payer: Self-pay | Admitting: Urology

## 2016-10-26 VITALS — BP 113/65 | HR 69 | Ht 63.0 in | Wt 175.0 lb

## 2016-10-26 DIAGNOSIS — R739 Hyperglycemia, unspecified: Secondary | ICD-10-CM | POA: Insufficient documentation

## 2016-10-26 DIAGNOSIS — D509 Iron deficiency anemia, unspecified: Secondary | ICD-10-CM | POA: Insufficient documentation

## 2016-10-26 DIAGNOSIS — N289 Disorder of kidney and ureter, unspecified: Secondary | ICD-10-CM | POA: Insufficient documentation

## 2016-10-26 DIAGNOSIS — R3129 Other microscopic hematuria: Secondary | ICD-10-CM

## 2016-10-26 DIAGNOSIS — I10 Essential (primary) hypertension: Secondary | ICD-10-CM | POA: Insufficient documentation

## 2016-10-26 DIAGNOSIS — N920 Excessive and frequent menstruation with regular cycle: Secondary | ICD-10-CM | POA: Insufficient documentation

## 2016-10-26 DIAGNOSIS — G4762 Sleep related leg cramps: Secondary | ICD-10-CM | POA: Insufficient documentation

## 2016-10-26 DIAGNOSIS — E1121 Type 2 diabetes mellitus with diabetic nephropathy: Secondary | ICD-10-CM | POA: Insufficient documentation

## 2016-10-26 DIAGNOSIS — N200 Calculus of kidney: Secondary | ICD-10-CM | POA: Insufficient documentation

## 2016-10-26 DIAGNOSIS — R000 Tachycardia, unspecified: Secondary | ICD-10-CM | POA: Insufficient documentation

## 2016-10-26 DIAGNOSIS — R002 Palpitations: Secondary | ICD-10-CM | POA: Insufficient documentation

## 2016-10-26 LAB — MICROSCOPIC EXAMINATION: Bacteria, UA: NONE SEEN

## 2016-10-26 LAB — URINALYSIS, COMPLETE
Bilirubin, UA: NEGATIVE
GLUCOSE, UA: NEGATIVE
KETONES UA: NEGATIVE
Leukocytes, UA: NEGATIVE
NITRITE UA: NEGATIVE
PROTEIN UA: NEGATIVE
UUROB: 0.2 mg/dL (ref 0.2–1.0)
pH, UA: 6 (ref 5.0–7.5)

## 2016-10-26 NOTE — Progress Notes (Signed)
10/26/2016 11:59 AM   Cheryl Kirk 1951/02/15 [id number]  Referring provider: Idelle Crouch, MD [facility] [place], [place] [address]  Chief Complaint  Patient presents with  . Hematuria    New Patient    HPI: 65 yo F referred by Dr. Lane Hacker for history of microscopic hematuria.    She notes that this is been going on for several years.  She did have a urinalysis by Dr. Doy Hutching in June 2017 which was demonstrated today again in our off in the absence of evidence of infection.  No gross hematuria.  No issues what UTI, urgency, frequency, leakage or dysuria.    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    Personal history of kidney stones.  She underwent ureteroscopy with Dr. Bernardo Heater 15 years ago.  No flank pain since that time.    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     No family history of bladder, kidney, or prostate cancer.     PMH: Past Medical History:  Diagnosis Date  . Chronic kidney disease   . Diabetes mellitus without complication ([place])   . Hyperlipidemia   . Hypertension    BP and occular  . Kidney stones   . Obesity     Surgical History: Past Surgical History:  Procedure Laterality Date  . CATARACT EXTRACTION Right October 2013  . DILATION AND CURETTAGE OF UTERUS    . TONSILLECTOMY    . URETEROSCOPY WITH HOLMIUM LASER LITHOTRIPSY      Home Medications:    Medication List       Accurate as of 10/26/16 11:59 AM. Always use your most recent med list.          cholecalciferol 400 units Tabs tablet Commonly known as:  VITAMIN D Take 400 Units by mouth daily. Vitamin d3   glipiZIDE 10 MG tablet Commonly known as:  GLUCOTROL Take 10 mg by mouth daily before breakfast.   latanoprost 0.005 % ophthalmic solution Commonly known as:  XALATAN Place 1 drop into both eyes at bedtime.   metFORMIN 1000 MG tablet Commonly known as:  GLUCOPHAGE Take 500 mg by mouth 2 (two)  times daily. 2 in am and 1 in pm   metoprolol succinate 50 MG 24 hr tablet Commonly known as:  TOPROL-XL TAKE ONE TABLET BY MOUTH 2 TIMES A DAY   Pitavastatin Calcium 2 MG Tabs Take 1 tablet by mouth daily. qam   sitaGLIPtin 100 MG tablet Commonly known as:  JANUVIA Take 100 mg by mouth daily. Only takes 2-3 times per week- MD aware   Vitamin C 500 MG Caps Take 1 tablet by mouth daily.       Allergies: No Known Allergies  Family History: Family History  Problem Relation Age of Onset  . Diabetes Mother   . Hypertension Mother   . Diabetes Father   . Heart disease Father   . Diabetes Sister   . Hypertension Sister   . Diabetes Brother   . Hypertension Brother   . Hypertension Sister   . Bladder Cancer Neg Hx   . Prostate cancer Neg Hx   . Kidney cancer Neg Hx     Social History:  reports that she quit smoking about 44 years ago. She quit after 1.50 years of use. She has never used smokeless tobacco. She reports that she drinks alcohol. She reports that she does not use drugs.  ROS: UROLOGY Frequent  Urination?: No Hard to postpone urination?: No Burning/pain with urination?: No Get up at night to urinate?: No Leakage of urine?: No Urine stream starts and stops?: No Trouble starting stream?: No Do you have to strain to urinate?: No Blood in urine?: Yes Urinary tract infection?: No Sexually transmitted disease?: No Injury to kidneys or bladder?: No Painful intercourse?: No Weak stream?: No Currently pregnant?: No Vaginal bleeding?: No Last menstrual period?: n  Gastrointestinal Nausea?: No Vomiting?: No Indigestion/heartburn?: No Diarrhea?: No Constipation?: No  Constitutional Fever: No Night sweats?: No Weight loss?: No Fatigue?: No  Skin Skin rash/lesions?: No Itching?: No  Eyes Blurred vision?: No Double vision?: No  Ears/Nose/Throat Sore throat?: No Sinus problems?: No  Hematologic/Lymphatic Swollen glands?: No Easy bruising?:  Yes  Cardiovascular Leg swelling?: No Chest pain?: No  Respiratory Cough?: No Shortness of breath?: No  Endocrine Excessive thirst?: No  Musculoskeletal Back pain?: No Joint pain?: No  Neurological Headaches?: No Dizziness?: No  Psychologic Depression?: No Anxiety?: No  Physical Exam: BP 113/65   Pulse 69   Ht 5\' 3"  (1.6 m)   Wt 175 lb (79.4 kg)   BMI 31.00 kg/m   Constitutional:  Alert and oriented, No acute distress. HEENT: [place] AT, moist mucus membranes.  Trachea midline, no masses. Cardiovascular: No clubbing, cyanosis.  Mild left lower extremity edema which is chronic. Respiratory: Normal respiratory effort, no increased work of breathing. GI: Abdomen is soft, nontender, nondistended, no abdominal masses GU: No CVA tenderness. Skin: No rashes, bruises or suspicious lesions. Lymph: No cervical or inguinal adenopathy. Neurologic: Grossly intact, no focal deficits, moving all 4 extremities. Psychiatric: Normal mood and affect.  Laboratory Data: Creatinine 1.0 on 05/17/2016   Urinalysis UA today in our office with 4-10 red blood cells per high-powered field, otherwise negative. See Epic.  Pertinent Imaging: No previous recent cross-sectional imaging  Assessment & Plan:    1. Microscopic hematuria We discussed the differential diagnosis for microscopic hematuria including nephrolithiasis, renal or upper tract tumors, bladder stones, UTIs, or bladder tumors as well as undetermined etiologies. Per AUA guidelines, I did recommend complete microscopic hematuria evaluation including CTU, possible urine cytology, and office cystoscopy. - Urinalysis, Complete - CT HEMATURIA WORKUP; Future  2. Renal insufficiency Cr 1.0, OK for IV contrast with hydration, hold metformin  Return in about 4 weeks (around 11/23/2016) for cystoscopy, f/u CT urogram.  Hollice Espy, MD  [facility] [address] [place], [place]  [id number] [phone]

## 2016-11-16 DIAGNOSIS — E1122 Type 2 diabetes mellitus with diabetic chronic kidney disease: Secondary | ICD-10-CM | POA: Diagnosis not present

## 2016-11-16 DIAGNOSIS — N183 Chronic kidney disease, stage 3 (moderate): Secondary | ICD-10-CM | POA: Diagnosis not present

## 2016-11-16 DIAGNOSIS — E7800 Pure hypercholesterolemia, unspecified: Secondary | ICD-10-CM | POA: Diagnosis not present

## 2016-11-16 DIAGNOSIS — Z79899 Other long term (current) drug therapy: Secondary | ICD-10-CM | POA: Diagnosis not present

## 2016-11-16 DIAGNOSIS — R319 Hematuria, unspecified: Secondary | ICD-10-CM | POA: Diagnosis not present

## 2016-11-24 DIAGNOSIS — N183 Chronic kidney disease, stage 3 (moderate): Secondary | ICD-10-CM | POA: Diagnosis not present

## 2016-11-24 DIAGNOSIS — E1122 Type 2 diabetes mellitus with diabetic chronic kidney disease: Secondary | ICD-10-CM | POA: Diagnosis not present

## 2016-11-28 DIAGNOSIS — H40003 Preglaucoma, unspecified, bilateral: Secondary | ICD-10-CM | POA: Diagnosis not present

## 2016-12-14 ENCOUNTER — Ambulatory Visit
Admission: RE | Admit: 2016-12-14 | Discharge: 2016-12-14 | Disposition: A | Discharge status: Home / Self Care | Payer: Medicare Other | Source: Ambulatory Visit | Attending: Urology | Admitting: Urology

## 2016-12-14 DIAGNOSIS — I700 Atherosclerosis of aorta: Secondary | ICD-10-CM | POA: Insufficient documentation

## 2016-12-14 DIAGNOSIS — R3129 Other microscopic hematuria: Secondary | ICD-10-CM | POA: Insufficient documentation

## 2016-12-14 MED ORDER — IOPAMIDOL (ISOVUE-300) INJECTION 61%
125.0000 mL | Freq: Once | INTRAVENOUS | Status: AC | PRN
  Administered 2016-12-14: 125 mL via INTRAVENOUS

## 2016-12-20 ENCOUNTER — Encounter: Payer: Self-pay | Admitting: Urology

## 2016-12-20 ENCOUNTER — Ambulatory Visit (INDEPENDENT_AMBULATORY_CARE_PROVIDER_SITE_OTHER): Payer: Medicare Other | Admitting: Urology

## 2016-12-20 ENCOUNTER — Telehealth: Payer: Self-pay | Admitting: Radiology

## 2016-12-20 VITALS — BP 101/66 | HR 68 | Ht 63.0 in | Wt 174.0 lb

## 2016-12-20 DIAGNOSIS — R3129 Other microscopic hematuria: Secondary | ICD-10-CM | POA: Diagnosis not present

## 2016-12-20 DIAGNOSIS — N329 Bladder disorder, unspecified: Secondary | ICD-10-CM

## 2016-12-20 DIAGNOSIS — N289 Disorder of kidney and ureter, unspecified: Secondary | ICD-10-CM

## 2016-12-20 LAB — URINALYSIS, COMPLETE
Bilirubin, UA: NEGATIVE
Glucose, UA: NEGATIVE
Ketones, UA: NEGATIVE
LEUKOCYTES UA: NEGATIVE
Nitrite, UA: NEGATIVE
PH UA: 5 (ref 5.0–7.5)
PROTEIN UA: NEGATIVE
Specific Gravity, UA: 1.02 (ref 1.005–1.030)
Urobilinogen, Ur: 0.2 mg/dL (ref 0.2–1.0)

## 2016-12-20 LAB — MICROSCOPIC EXAMINATION

## 2016-12-20 MED ORDER — CIPROFLOXACIN HCL 500 MG PO TABS
500.0000 mg | ORAL_TABLET | Freq: Once | ORAL | Status: AC
  Administered 2016-12-20: 500 mg via ORAL

## 2016-12-20 MED ORDER — LIDOCAINE HCL 2 % EX GEL
1.0000 "application " | Freq: Once | CUTANEOUS | Status: AC
  Administered 2016-12-20: 1 via URETHRAL

## 2016-12-20 NOTE — Telephone Encounter (Signed)
Notified pt of surgery scheduled with Dr Erlene Quan on 01/02/17, pre-admit testing appt on 12/26/16 @10 :30 & to call day prior to surgery for arrival time to SDS. Pt voices understanding.

## 2016-12-20 NOTE — Progress Notes (Signed)
12/20/16  CC:  Chief Complaint  Patient presents with  . Cysto    HPI: 66 yo F referred by Dr. Lane Hacker for history of microscopic hematuria.  She returns to the office today to complete her microscopic hematuria workup.    CT urogram on 12/14/2016 negative for any GU pathology. Incidental moderate atheroclerosis within the aorta identified.  She notes that this is been going on for several years.  She did have a urinalysis by Dr. Doy Hutching in June 2017 which was demonstrated today again in our off in the absence of evidence of infection.  No gross hematuria.  No issues what UTI, urgency, frequency, leakage or dysuria.    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    Personal history of kidney stones.  She underwent ureteroscopy with Dr. Bernardo Heater 15 years ago.  No flank pain since that time.    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     Past Medical History:  Diagnosis Date  . Chronic kidney disease   . Diabetes mellitus without complication ([place])   . Hyperlipidemia   . Hypertension    BP and occular  . Kidney stones   . Obesity    Past Surgical History:  Procedure Laterality Date  . CATARACT EXTRACTION Right October 2013  . DILATION AND CURETTAGE OF UTERUS    . TONSILLECTOMY    . URETEROSCOPY WITH HOLMIUM LASER LITHOTRIPSY     No Known Allergies  Current Meds  Medication Sig  . latanoprost (XALATAN) 0.005 % ophthalmic solution Place 1 drop into both eyes at bedtime.  . metoprolol succinate (TOPROL-XL) 50 MG 24 hr tablet Take 50 mg by mouth daily.   . [DISCONTINUED] Ascorbic Acid (VITAMIN C) 500 MG CAPS Take 1 tablet by mouth daily.  . [DISCONTINUED] cholecalciferol (VITAMIN D) 400 UNITS TABS tablet Take 400 Units by mouth daily. Vitamin d3  . [DISCONTINUED] glipiZIDE (GLUCOTROL) 10 MG tablet Take 10 mg by mouth daily before breakfast.  . [DISCONTINUED] metFORMIN (GLUCOPHAGE) 1000 MG tablet Take 500 mg by mouth 2 (two) times  daily. 2 in am and 1 in pm  . [DISCONTINUED] Pitavastatin Calcium 2 MG TABS Take 1 tablet by mouth daily. qam  . [DISCONTINUED] sitaGLIPtin (JANUVIA) 100 MG tablet Take 100 mg by mouth daily. Only takes 2-3 times per week- MD aware     Vitals:   12/20/16 0939  BP: 101/66  Pulse: 68   NED. A&Ox3.   No respiratory distress   Abd soft, NT, ND Normal external genitalia with patent urethral meatus  CT urogram personally reviewed today  12/14/16 IMPRESSION: 1. No CT findings to account for the patient's microhematuria. No renal, ureteral or bladder calculi or mass. 2. No acute abdominal/pelvic findings, mass lesions or adenopathy. 3. Moderate atherosclerotic calcifications involving the aorta branch vessels.   Electronically Signed   By: Marijo Sanes M.D.   On: 12/14/2016 10:42  Cystoscopy Procedure Note  Patient identification was confirmed, informed consent was obtained, and patient was prepped using Betadine solution.  Lidocaine jelly was administered per urethral meatus.    Preoperative abx where received prior to procedure.    Procedure: - Flexible cystoscope introduced, without any difficulty.   - Thorough search of the bladder revealed:    normal urethral meatus    Subtle papillary/frondular changes within the proximal urethra extending just within the bladder neck.    normal urothelium    no stones    no ulcers  no tumors    no urethral polyps    no trabeculation  - Ureteral orifices were normal in position and appearance.  Retroflexion showed fine fronds just at the bladder neck, <5 mm  Post-Procedure: - Patient tolerated the procedure well  Assessment/ Plan:  1. Microscopic hematuria S/p cystoscopy/ CT urogram Changes as noted above on cystoscopy - Urinalysis, Complete - ciprofloxacin (CIPRO) tablet 500 mg; Take 1 tablet (500 mg total) by mouth once. - lidocaine (XYLOCAINE) 2 % jelly 1 application; Place 1 application into the urethra once.  2.  Lesion of bladder Very small subtle changes involving bladder neck and proximal urethra.  I suspect these are likely benign, however, I do recommend biopsy to rule out any malignancy Risk and benefits of bladder/proximal urethral biopsy discussed in detail. These include risk of incontinence, dysuria, gross hematuria, infection, damage to surrounding structures, urinary retention, need for Foley catheterization possibly amongst others. All of her questions were answered.  She is willing to proceed as planned.  3. Renal insufficiency Stable  Schedule surgery as above  Hollice Espy, MD

## 2016-12-26 ENCOUNTER — Other Ambulatory Visit: Payer: Self-pay | Admitting: Radiology

## 2016-12-26 ENCOUNTER — Encounter
Admission: RE | Admit: 2016-12-26 | Discharge: 2016-12-26 | Disposition: A | Discharge status: Home / Self Care | Payer: Medicare Other | Source: Ambulatory Visit | Attending: Urology | Admitting: Urology

## 2016-12-26 DIAGNOSIS — E119 Type 2 diabetes mellitus without complications: Secondary | ICD-10-CM | POA: Diagnosis not present

## 2016-12-26 DIAGNOSIS — I10 Essential (primary) hypertension: Secondary | ICD-10-CM | POA: Insufficient documentation

## 2016-12-26 DIAGNOSIS — N329 Bladder disorder, unspecified: Secondary | ICD-10-CM

## 2016-12-26 NOTE — Patient Instructions (Signed)
Your procedure is scheduled on: Tuesday 01/02/17 Report to [facility]. 2ND FLOOR MEDICAL MALL ENTRANCE. To find out your arrival time please call [phone] between 1PM - 3PM on Monday 01/01/17.  Remember: Instructions that are not followed completely may result in serious medical risk, up to and including death, or upon the discretion of your surgeon and anesthesiologist your surgery may need to be rescheduled.    __X__ 1. Do not eat food or drink liquids after midnight. No gum chewing or hard candies.     __X__ 2. No Alcohol for 24 hours before or after surgery.   ____ 3. Bring all medications with you on the day of surgery if instructed.    __X__ 4. Notify your doctor if there is any change in your medical condition     (cold, fever, infections).             ___X__5. No smoking within 24 hours of your surgery.     Do not wear jewelry, make-up, hairpins, clips or nail polish.  Do not wear lotions, powders, or perfumes.   Do not shave 48 hours prior to surgery. Men may shave face and neck.  Do not bring valuables to the hospital.    [facility] is not responsible for any belongings or valuables.               Contacts, dentures or bridgework may not be worn into surgery.  Leave your suitcase in the car. After surgery it may be brought to your room.  For patients admitted to the hospital, discharge time is determined by your                treatment team.   Patients discharged the day of surgery will not be allowed to drive home.   Please read over the following fact sheets that you were given:   Pain Booklet and MRSA Information   __X__ Take these medicines the morning of surgery with A SIP OF WATER:    1. METOPROLOL  2. OLMESARTAN  3. PITAVASTATIN  4.  5.  6.  ____ Fleet Enema (as directed)   ____ Use CHG Soap as directed  ____ Use inhalers on the day of surgery  ____ Stop metformin 2 days prior to surgery    ____ Take 1/2 of usual insulin dose the night before  surgery and none on the morning of surgery.   ____ Stop Coumadin/Plavix/aspirin on   __X__ Stop Anti-inflammatories such as Advil, Aleve, Ibuprofen, Motrin, Naproxen, Naprosyn, Goodies,powder, or aspirin products.  OK to take Tylenol.   ____ Stop supplements until after surgery.    ____ Bring C-Pap to the hospital.

## 2016-12-26 NOTE — Pre-Procedure Instructions (Signed)
REQUEST FOR MEDICAL CLEARANCE / EKG, AS INSTRUCTED BY DR Purple Sage FAXED TO DR Doy Hutching AND DR Cherrie Gauze OFFICE. NOTIFIED PATIENT.SPOKE WITH CHARLENE AT DR [facility] OFFICE

## 2016-12-27 LAB — URINE CULTURE: Culture: NO GROWTH

## 2016-12-27 NOTE — Pre-Procedure Instructions (Signed)
CLEARED BY DR Doy Hutching 12/26/16 LOW RISK

## 2017-01-01 MED ORDER — CEFAZOLIN IN D5W 1 GM/50ML IV SOLN
1.0000 g | INTRAVENOUS | Status: AC
  Administered 2017-01-02: 1 g via INTRAVENOUS

## 2017-01-02 ENCOUNTER — Encounter: Payer: Self-pay | Admitting: *Deleted

## 2017-01-02 ENCOUNTER — Ambulatory Visit: Payer: Medicare Other | Admitting: Anesthesiology

## 2017-01-02 ENCOUNTER — Encounter
Admission: RE | Disposition: A | Discharge status: Home / Self Care | Payer: Self-pay | Source: Ambulatory Visit | Attending: Urology

## 2017-01-02 ENCOUNTER — Ambulatory Visit
Admission: RE | Admit: 2017-01-02 | Discharge: 2017-01-02 | Disposition: A | Discharge status: Home / Self Care | Payer: Medicare Other | Source: Ambulatory Visit | Attending: Urology | Admitting: Urology

## 2017-01-02 DIAGNOSIS — N189 Chronic kidney disease, unspecified: Secondary | ICD-10-CM | POA: Insufficient documentation

## 2017-01-02 DIAGNOSIS — I129 Hypertensive chronic kidney disease with stage 1 through stage 4 chronic kidney disease, or unspecified chronic kidney disease: Secondary | ICD-10-CM | POA: Diagnosis not present

## 2017-01-02 DIAGNOSIS — Z79899 Other long term (current) drug therapy: Secondary | ICD-10-CM | POA: Diagnosis not present

## 2017-01-02 DIAGNOSIS — Z87891 Personal history of nicotine dependence: Secondary | ICD-10-CM | POA: Diagnosis not present

## 2017-01-02 DIAGNOSIS — R311 Benign essential microscopic hematuria: Secondary | ICD-10-CM | POA: Diagnosis not present

## 2017-01-02 DIAGNOSIS — N3081 Other cystitis with hematuria: Secondary | ICD-10-CM | POA: Diagnosis not present

## 2017-01-02 DIAGNOSIS — E1122 Type 2 diabetes mellitus with diabetic chronic kidney disease: Secondary | ICD-10-CM | POA: Diagnosis not present

## 2017-01-02 DIAGNOSIS — Z87442 Personal history of urinary calculi: Secondary | ICD-10-CM | POA: Diagnosis not present

## 2017-01-02 DIAGNOSIS — E119 Type 2 diabetes mellitus without complications: Secondary | ICD-10-CM | POA: Diagnosis not present

## 2017-01-02 DIAGNOSIS — Z6830 Body mass index (BMI) 30.0-30.9, adult: Secondary | ICD-10-CM | POA: Diagnosis not present

## 2017-01-02 DIAGNOSIS — N329 Bladder disorder, unspecified: Secondary | ICD-10-CM | POA: Insufficient documentation

## 2017-01-02 DIAGNOSIS — D494 Neoplasm of unspecified behavior of bladder: Secondary | ICD-10-CM | POA: Diagnosis not present

## 2017-01-02 DIAGNOSIS — E669 Obesity, unspecified: Secondary | ICD-10-CM | POA: Diagnosis not present

## 2017-01-02 DIAGNOSIS — E785 Hyperlipidemia, unspecified: Secondary | ICD-10-CM | POA: Insufficient documentation

## 2017-01-02 DIAGNOSIS — R3129 Other microscopic hematuria: Secondary | ICD-10-CM | POA: Diagnosis not present

## 2017-01-02 DIAGNOSIS — Z7984 Long term (current) use of oral hypoglycemic drugs: Secondary | ICD-10-CM | POA: Diagnosis not present

## 2017-01-02 HISTORY — PX: CYSTOSCOPY WITH BIOPSY: SHX5122

## 2017-01-02 HISTORY — DX: Personal history of urinary calculi: Z87.442

## 2017-01-02 HISTORY — DX: Cardiac murmur, unspecified: R01.1

## 2017-01-02 LAB — GLUCOSE, CAPILLARY
GLUCOSE-CAPILLARY: 177 mg/dL — AB (ref 65–99)
GLUCOSE-CAPILLARY: 136 mg/dL — AB (ref 65–99)

## 2017-01-02 SURGERY — CYSTOSCOPY, WITH BIOPSY
Anesthesia: General | Site: Bladder | Wound class: Clean Contaminated

## 2017-01-02 MED ORDER — PROPOFOL 10 MG/ML IV BOLUS
INTRAVENOUS | Status: AC
  Filled 2017-01-02: qty 20

## 2017-01-02 MED ORDER — PHENAZOPYRIDINE HCL 200 MG PO TABS: 200.0000 mg | ORAL_TABLET | Freq: Three times a day (TID) | ORAL | 0 refills | Status: DC | PRN

## 2017-01-02 MED ORDER — PROPOFOL 10 MG/ML IV BOLUS
INTRAVENOUS | Status: DC | PRN
  Administered 2017-01-02: 50 mg via INTRAVENOUS
  Administered 2017-01-02 (×2): 10 mg via INTRAVENOUS

## 2017-01-02 MED ORDER — ONDANSETRON HCL 4 MG/2ML IJ SOLN
INTRAMUSCULAR | Status: AC
  Filled 2017-01-02: qty 2

## 2017-01-02 MED ORDER — PHENYLEPHRINE HCL 10 MG/ML IJ SOLN
INTRAMUSCULAR | Status: DC | PRN
  Administered 2017-01-02 (×2): 80 ug via INTRAVENOUS
  Administered 2017-01-02: 40 ug via INTRAVENOUS
  Administered 2017-01-02: 80 ug via INTRAVENOUS

## 2017-01-02 MED ORDER — MIDAZOLAM HCL 2 MG/2ML IJ SOLN
INTRAMUSCULAR | Status: AC
  Filled 2017-01-02: qty 2

## 2017-01-02 MED ORDER — DEXAMETHASONE SODIUM PHOSPHATE 10 MG/ML IJ SOLN
INTRAMUSCULAR | Status: AC
  Filled 2017-01-02: qty 1

## 2017-01-02 MED ORDER — KETOROLAC TROMETHAMINE 30 MG/ML IJ SOLN
INTRAMUSCULAR | Status: DC | PRN
  Administered 2017-01-02: 30 mg via INTRAVENOUS

## 2017-01-02 MED ORDER — DOCUSATE SODIUM 100 MG PO CAPS: 100.0000 mg | ORAL_CAPSULE | Freq: Two times a day (BID) | ORAL | 0 refills | Status: DC

## 2017-01-02 MED ORDER — PHENYLEPHRINE 40 MCG/ML (10ML) SYRINGE FOR IV PUSH (FOR BLOOD PRESSURE SUPPORT)
PREFILLED_SYRINGE | INTRAVENOUS | Status: AC
  Filled 2017-01-02: qty 10

## 2017-01-02 MED ORDER — MIDAZOLAM HCL 2 MG/2ML IJ SOLN
INTRAMUSCULAR | Status: DC | PRN
  Administered 2017-01-02: 2 mg via INTRAVENOUS

## 2017-01-02 MED ORDER — FENTANYL CITRATE (PF) 100 MCG/2ML IJ SOLN: 25.0000 ug | INTRAMUSCULAR | Status: DC | PRN

## 2017-01-02 MED ORDER — FENTANYL CITRATE (PF) 100 MCG/2ML IJ SOLN
INTRAMUSCULAR | Status: AC
  Filled 2017-01-02: qty 2

## 2017-01-02 MED ORDER — FAMOTIDINE 20 MG PO TABS
ORAL_TABLET | ORAL | Status: AC
  Administered 2017-01-02: 20 mg via ORAL
  Filled 2017-01-02: qty 1

## 2017-01-02 MED ORDER — SODIUM CHLORIDE 0.9 % IV SOLN
INTRAVENOUS | Status: DC
  Administered 2017-01-02: 11:00:00 via INTRAVENOUS

## 2017-01-02 MED ORDER — ONDANSETRON HCL 4 MG/2ML IJ SOLN
INTRAMUSCULAR | Status: DC | PRN
  Administered 2017-01-02: 4 mg via INTRAVENOUS

## 2017-01-02 MED ORDER — FENTANYL CITRATE (PF) 100 MCG/2ML IJ SOLN
INTRAMUSCULAR | Status: DC | PRN
  Administered 2017-01-02 (×2): 50 ug via INTRAVENOUS

## 2017-01-02 MED ORDER — PROPOFOL 500 MG/50ML IV EMUL
INTRAVENOUS | Status: DC | PRN
  Administered 2017-01-02: 50 ug/kg/min via INTRAVENOUS

## 2017-01-02 MED ORDER — FAMOTIDINE 20 MG PO TABS
20.0000 mg | ORAL_TABLET | Freq: Once | ORAL | Status: AC
  Administered 2017-01-02: 20 mg via ORAL

## 2017-01-02 MED ORDER — CEFAZOLIN IN D5W 1 GM/50ML IV SOLN
INTRAVENOUS | Status: AC
Start: 2017-01-02 — End: 2017-01-02
  Administered 2017-01-02: 1 g via INTRAVENOUS
  Filled 2017-01-02: qty 50

## 2017-01-02 MED ORDER — OXYBUTYNIN CHLORIDE 5 MG PO TABS: 5.0000 mg | ORAL_TABLET | Freq: Three times a day (TID) | ORAL | 0 refills | Status: DC | PRN

## 2017-01-02 MED ORDER — ONDANSETRON HCL 4 MG/2ML IJ SOLN: 4.0000 mg | Freq: Once | INTRAMUSCULAR | Status: DC | PRN

## 2017-01-02 MED ORDER — KETOROLAC TROMETHAMINE 30 MG/ML IJ SOLN
INTRAMUSCULAR | Status: AC
  Filled 2017-01-02: qty 1

## 2017-01-02 MED ORDER — DEXAMETHASONE SODIUM PHOSPHATE 10 MG/ML IJ SOLN
INTRAMUSCULAR | Status: DC | PRN
  Administered 2017-01-02: 10 mg via INTRAVENOUS

## 2017-01-02 SURGICAL SUPPLY — 16 items
BAG DRAIN CYSTO-URO LG1000N (MISCELLANEOUS) ×2 IMPLANT
DRESSING TELFA 4X3 1S ST N-ADH (GAUZE/BANDAGES/DRESSINGS) ×2 IMPLANT
ELECT REM PT RETURN 9FT ADLT (ELECTROSURGICAL) ×2
ELECTRODE REM PT RTRN 9FT ADLT (ELECTROSURGICAL) ×1 IMPLANT
GLOVE BIO SURGEON STRL SZ 6.5 (GLOVE) ×2 IMPLANT
GOWN STRL REUS W/ TWL LRG LVL3 (GOWN DISPOSABLE) ×2 IMPLANT
GOWN STRL REUS W/TWL LRG LVL3 (GOWN DISPOSABLE) ×2
KIT RM TURNOVER CYSTO AR (KITS) ×2 IMPLANT
NDL SAFETY ECLIPSE 18X1.5 (NEEDLE) ×1 IMPLANT
NEEDLE HYPO 18GX1.5 SHARP (NEEDLE) ×1
PACK CYSTO AR (MISCELLANEOUS) ×2 IMPLANT
SCRUB POVIDONE IODINE 4 OZ (MISCELLANEOUS) ×2 IMPLANT
SET CYSTO W/LG BORE CLAMP LF (SET/KITS/TRAYS/PACK) ×2 IMPLANT
SURGILUBE 2OZ TUBE FLIPTOP (MISCELLANEOUS) ×2 IMPLANT
WATER STERILE IRR 1000ML POUR (IV SOLUTION) ×2 IMPLANT
WATER STERILE IRR 3000ML UROMA (IV SOLUTION) ×2 IMPLANT

## 2017-01-02 NOTE — Anesthesia Postprocedure Evaluation (Signed)
Anesthesia Post Note  Patient: Cheryl Kirk  Procedure(s) Performed: Procedure(s) (LRB): CYSTOSCOPY WITH BIOPSY (N/A)  Patient location during evaluation: PACU Anesthesia Type: General Level of consciousness: awake and alert Pain management: pain level controlled Vital Signs Assessment: post-procedure vital signs reviewed and stable Respiratory status: spontaneous breathing, nonlabored ventilation, respiratory function stable and patient connected to nasal cannula oxygen Cardiovascular status: blood pressure returned to baseline and stable Postop Assessment: no signs of nausea or vomiting Anesthetic complications: no     Last Vitals:  Vitals:   01/02/17 1218 01/02/17 1242  BP: (!) 134/38 (!) 124/53  Pulse: (!) 58 (!) 59  Resp: 18 18  Temp: (!) 35.9 C     Last Pain:  Vitals:   01/02/17 0936  TempSrc: Oral                 Molli Barrows

## 2017-01-02 NOTE — Anesthesia Post-op Follow-up Note (Cosign Needed)
Anesthesia QCDR form completed.        

## 2017-01-02 NOTE — H&P (View-Only) (Signed)
12/20/16  CC:  Chief Complaint  Patient presents with  . Cysto    HPI: 66 yo F referred by Dr. Lane Hacker for history of microscopic hematuria.  She returns to the office today to complete her microscopic hematuria workup.    CT urogram on 12/14/2016 negative for any GU pathology. Incidental moderate atheroclerosis within the aorta identified.  She notes that this is been going on for several years.  She did have a urinalysis by Dr. Doy Hutching in June 2017 which was demonstrated today again in our off in the absence of evidence of infection.  No gross hematuria.  No issues what UTI, urgency, frequency, leakage or dysuria.    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    Personal history of kidney stones.  She underwent ureteroscopy with Dr. Bernardo Heater 15 years ago.  No flank pain since that time.    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     Past Medical History:  Diagnosis Date  . Chronic kidney disease   . Diabetes mellitus without complication ([place])   . Hyperlipidemia   . Hypertension    BP and occular  . Kidney stones   . Obesity    Past Surgical History:  Procedure Laterality Date  . CATARACT EXTRACTION Right October 2013  . DILATION AND CURETTAGE OF UTERUS    . TONSILLECTOMY    . URETEROSCOPY WITH HOLMIUM LASER LITHOTRIPSY     No Known Allergies  Current Meds  Medication Sig  . latanoprost (XALATAN) 0.005 % ophthalmic solution Place 1 drop into both eyes at bedtime.  . metoprolol succinate (TOPROL-XL) 50 MG 24 hr tablet Take 50 mg by mouth daily.   . [DISCONTINUED] Ascorbic Acid (VITAMIN C) 500 MG CAPS Take 1 tablet by mouth daily.  . [DISCONTINUED] cholecalciferol (VITAMIN D) 400 UNITS TABS tablet Take 400 Units by mouth daily. Vitamin d3  . [DISCONTINUED] glipiZIDE (GLUCOTROL) 10 MG tablet Take 10 mg by mouth daily before breakfast.  . [DISCONTINUED] metFORMIN (GLUCOPHAGE) 1000 MG tablet Take 500 mg by mouth 2 (two) times  daily. 2 in am and 1 in pm  . [DISCONTINUED] Pitavastatin Calcium 2 MG TABS Take 1 tablet by mouth daily. qam  . [DISCONTINUED] sitaGLIPtin (JANUVIA) 100 MG tablet Take 100 mg by mouth daily. Only takes 2-3 times per week- MD aware     Vitals:   12/20/16 0939  BP: 101/66  Pulse: 68   NED. A&Ox3.   No respiratory distress   Abd soft, NT, ND Normal external genitalia with patent urethral meatus  CT urogram personally reviewed today  12/14/16 IMPRESSION: 1. No CT findings to account for the patient's microhematuria. No renal, ureteral or bladder calculi or mass. 2. No acute abdominal/pelvic findings, mass lesions or adenopathy. 3. Moderate atherosclerotic calcifications involving the aorta branch vessels.   Electronically Signed   By: Marijo Sanes M.D.   On: 12/14/2016 10:42  Cystoscopy Procedure Note  Patient identification was confirmed, informed consent was obtained, and patient was prepped using Betadine solution.  Lidocaine jelly was administered per urethral meatus.    Preoperative abx where received prior to procedure.    Procedure: - Flexible cystoscope introduced, without any difficulty.   - Thorough search of the bladder revealed:    normal urethral meatus    Subtle papillary/frondular changes within the proximal urethra extending just within the bladder neck.    normal urothelium    no stones    no ulcers  no tumors    no urethral polyps    no trabeculation  - Ureteral orifices were normal in position and appearance.  Retroflexion showed fine fronds just at the bladder neck, <5 mm  Post-Procedure: - Patient tolerated the procedure well  Assessment/ Plan:  1. Microscopic hematuria S/p cystoscopy/ CT urogram Changes as noted above on cystoscopy - Urinalysis, Complete - ciprofloxacin (CIPRO) tablet 500 mg; Take 1 tablet (500 mg total) by mouth once. - lidocaine (XYLOCAINE) 2 % jelly 1 application; Place 1 application into the urethra once.  2.  Lesion of bladder Very small subtle changes involving bladder neck and proximal urethra.  I suspect these are likely benign, however, I do recommend biopsy to rule out any malignancy Risk and benefits of bladder/proximal urethral biopsy discussed in detail. These include risk of incontinence, dysuria, gross hematuria, infection, damage to surrounding structures, urinary retention, need for Foley catheterization possibly amongst others. All of her questions were answered.  She is willing to proceed as planned.  3. Renal insufficiency Stable  Schedule surgery as above  Hollice Espy, MD

## 2017-01-02 NOTE — Discharge Instructions (Signed)
Transurethral Resection of Bladder Tumor (TURBT) or Bladder Biopsy ° ° °Definition: ° Transurethral Resection of the Bladder Tumor is a surgical procedure used to diagnose and remove tumors within the bladder. TURBT is the most common treatment for early stage bladder cancer. ° °General instructions: °   ° Your recent bladder surgery requires very little post hospital care but some definite precautions. ° °Despite the fact that no skin incisions were used, the area around the bladder incisions are raw and covered with scabs to promote healing and prevent bleeding. Certain precautions are needed to insure that the scabs are not disturbed over the next 2-4 weeks while the healing proceeds. ° °Because the raw surface inside your bladder and the irritating effects of urine you may expect frequency of urination and/or urgency (a stronger desire to urinate) and perhaps even getting up at night more often. This will usually resolve or improve slowly over the healing period. You may see some blood in your urine over the first 6 weeks. Do not be alarmed, even if the urine was clear for a while. Get off your feet and drink lots of fluids until clearing occurs. If you start to pass clots or don't improve call us. ° °Diet: ° °You may return to your normal diet immediately. Because of the raw surface of your bladder, alcohol, spicy foods, foods high in acid and drinks with caffeine may cause irritation or frequency and should be used in moderation. To keep your urine flowing freely and avoid constipation, drink plenty of fluids during the day (8-10 glasses). Tip: Avoid cranberry juice because it is very acidic. ° °Activity: ° °Your physical activity doesn't need to be restricted. However, if you are very active, you may see some blood in the urine. We suggest that you reduce your activity under the circumstances until the bleeding has stopped. ° °Bowels: ° °It is important to keep your bowels regular during the postoperative  period. Straining with bowel movements can cause bleeding. A bowel movement every other day is reasonable. Use a mild laxative if needed, such as milk of magnesia 2-3 tablespoons, or 2 Dulcolax tablets. Call if you continue to have problems. If you had been taking narcotics for pain, before, during or after your surgery, you may be constipated. Take a laxative if necessary. ° ° ° °Medication: ° °You should resume your pre-surgery medications unless told not to. In addition you may be given an antibiotic to prevent or treat infection. Antibiotics are not always necessary. All medication should be taken as prescribed until the bottles are finished unless you are having an unusual reaction to one of the drugs. ° ° °[facility] °[address], [address] °[place], [place] [address] °[phone] ° ° ° ° °

## 2017-01-02 NOTE — Transfer of Care (Signed)
Immediate Anesthesia Transfer of Care Note  Patient: Cheryl Kirk  Procedure(s) Performed: Procedure(s): CYSTOSCOPY WITH BIOPSY (N/A)  Patient Location: PACU  Anesthesia Type:General  Level of Consciousness: awake, alert  and oriented  Airway & Oxygen Therapy: Patient Spontanous Breathing  Post-op Assessment: Report given to RN and Post -op Vital signs reviewed and stable  Post vital signs: Reviewed and stable  Last Vitals:  Vitals:   01/02/17 0936 01/02/17 1140  BP: 127/62 (!) 91/50  Pulse: 72 70  Resp: 16 12  Temp: 36.6 C 36.5 C    Last Pain:  Vitals:   01/02/17 0936  TempSrc: Oral      Patients Stated Pain Goal: 0 ([id number])  Complications: No apparent anesthesia complications

## 2017-01-02 NOTE — Op Note (Signed)
Date of procedure: 01/02/17  Preoperative diagnosis:  1. Microscopic hematuria 2. Bladder neck/proximal ureteral lesion   Postoperative diagnosis:  1. Same as above   Procedure: 1. Cystoscopy 2. Bladder neck/proximal urethral biopsy  Surgeon: Hollice Espy, MD  Anesthesia: Monitored anesthesia care  Complications: None  Intraoperative findings: Fine pedunculated lesions on fine stalk x ~4 located at the junction between the bladder neck and proximal urethra anteriorly and at 1:00 and 11:00 positions. No other bladder lesions identified.   EBL: Minimal  Specimens: Free floating bladder debris, bladder neck/urethral biopsy  Drains: None  Indication: Cheryl Kirk is a 66 y.o. patient with endoscopic hematuria who underwent office cystoscopy for further evaluation of this. Incidentally, very fine pedunculated lesions were identified at the junction of the bladder neck and urethra.  After reviewing the management options for treatment, he elected to proceed with the above surgical procedure(s). We have discussed the potential benefits and risks of the procedure, side effects of the proposed treatment, the likelihood of the patient achieving the goals of the procedure, and any potential problems that might occur during the procedure or recuperation. Informed consent has been obtained.  Description of procedure:  The patient was taken to the operating room and monitored anesthesia care was induced.  The patient was placed in the dorsal lithotomy position, prepped and draped in the usual sterile fashion, and preoperative antibiotics were administered. A preoperative time-out was performed.   A 21 French scope was advanced per urethra into the bladder. There is a small bit of floating debris within the bladder which appeared to be mucosal in nature. This was grabbed using graspers and sent for pathologic analysis. The remainder of the bladder was unremarkable. At the junction of the  proximal urethra and bladder neck, there were a few small pedunculated lesions on a fine stalk with a vascular structure running through the stalk itself. These were located at the 12:00, 1:00, and 11:00 positions on the bladder neck. Cold cup biopsy forceps were used to carefully biopsy these very small lesions. There is very little bleeding noted. Extremely judicious use of Bugbee electrocautery was used with in the bladder neck but not within the urethra itself for hemostasis. No residual lesions were identified. A flexible cystoscope was then used for retroflexion to inspect the bladder neck and there were no additional concerning findings. The scope was then removed. The patient was then reversed med seizure and taken the PACU in stable condition.  Plan: I'll call the patient with her pathology results to discuss and formulate plan based on results.  Hollice Espy, M.D.

## 2017-01-02 NOTE — Anesthesia Preprocedure Evaluation (Signed)
Anesthesia Evaluation  Patient identified by MRN, date of birth, ID band Patient awake    Reviewed: Allergy & Precautions, H&P , NPO status , Patient's Chart, lab work & pertinent test results, reviewed documented beta blocker date and time   Airway Mallampati: II   Neck ROM: full    Dental  (+) Poor Dentition   Pulmonary neg pulmonary ROS, former smoker,    Pulmonary exam normal        Cardiovascular Exercise Tolerance: Good hypertension, negative cardio ROS Normal cardiovascular exam+ Valvular Problems/Murmurs  Rhythm:regular Rate:Normal     Neuro/Psych negative neurological ROS  negative psych ROS   GI/Hepatic negative GI ROS, Neg liver ROS,   Endo/Other  negative endocrine ROSdiabetes, Well Controlled, Type 2  Renal/GU CRFRenal diseasenegative Renal ROS  negative genitourinary   Musculoskeletal   Abdominal   Peds  Hematology negative hematology ROS (+) anemia ,   Anesthesia Other Findings Past Medical History: No date: Chronic kidney disease No date: Diabetes mellitus without complication (HCC) No date: Heart murmur     Comment: needs no follow up No date: History of kidney stones     Comment: many years ago No date: Hyperlipidemia No date: Hypertension     Comment: BP and occular No date: Kidney stones No date: Obesity Past Surgical History: 2009: CARDIAC CATHETERIZATION October 2013: CATARACT EXTRACTION Right No date: DILATION AND CURETTAGE OF UTERUS No date: TONSILLECTOMY No date: URETEROSCOPY WITH HOLMIUM LASER LITHOTRIPSY BMI    Body Mass Index:  30.82 kg/m     Reproductive/Obstetrics negative OB ROS                             Anesthesia Physical Anesthesia Plan  ASA: III  Anesthesia Plan: General   Post-op Pain Management:    Induction:   Airway Management Planned:   Additional Equipment:   Intra-op Plan:   Post-operative Plan:   Informed Consent:  I have reviewed the patients History and Physical, chart, labs and discussed the procedure including the risks, benefits and alternatives for the proposed anesthesia with the patient or authorized representative who has indicated his/her understanding and acceptance.   Dental Advisory Given  Plan Discussed with: CRNA  Anesthesia Plan Comments:         Anesthesia Quick Evaluation

## 2017-01-02 NOTE — Interval H&P Note (Signed)
History and Physical Interval Note:  01/02/2017 10:31 AM  Cheryl Kirk  has presented today for surgery, with the diagnosis of BLADDER LESION  The various methods of treatment have been discussed with the patient and family. After consideration of risks, benefits and other options for treatment, the patient has consented to  Procedure(s): CYSTOSCOPY WITH BIOPSY (N/A) as a surgical intervention .  The patient's history has been reviewed, patient examined, no change in status, stable for surgery.  I have reviewed the patient's chart and labs.  Questions were answered to the patient's satisfaction.    RRR CTAB  Hollice Espy

## 2017-01-03 ENCOUNTER — Encounter: Payer: Self-pay | Admitting: Urology

## 2017-01-03 LAB — SURGICAL PATHOLOGY

## 2017-01-04 ENCOUNTER — Telehealth: Payer: Self-pay | Admitting: Urology

## 2017-01-04 NOTE — Telephone Encounter (Signed)
Pathology was reviewed with the patient. 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    All questions answered.    No follow up needed.    Hollice Espy, MD

## 2017-01-07 ENCOUNTER — Encounter: Payer: Self-pay | Admitting: Urology

## 2017-01-10 ENCOUNTER — Ambulatory Visit: Payer: Self-pay

## 2017-01-22 ENCOUNTER — Other Ambulatory Visit: Payer: Self-pay

## 2017-01-22 VITALS — BP 116/64 | Ht 63.0 in | Wt 170.4 lb

## 2017-01-22 DIAGNOSIS — E119 Type 2 diabetes mellitus without complications: Secondary | ICD-10-CM

## 2017-01-22 NOTE — Patient Outreach (Signed)
[facility]) Care Management  [place]  01/22/2017   Cheryl Kirk 04/01/1951 [id number]  Subjective: Cheryl Kirk in for her final Cheryl Kirk to Wellness visit- she is no longer on [employer] and has transferred to [facility].  She continues to work at [employer], about 3 days per week. She is very pleased with her blood sugars , BP and weight loss.  She started Weight Watchers 2 weeks ago and lost 7lbs the first week. She is tracking all her dietary intake on the Weight Watchers and the Lose it app and is taking in about 1300 calories per day.  She continues to track her daily "steps" and exercise- she rarely gets below 10,000 steps a day.  She is wearing a Ecologist.   She has noticed a slight decrease in fasting blood sugars since she started Weight Watchers and notices if she doesn't eat enough carbohydrates, her fasting blood sugars will go quite high (138. 151, 164, 214 mg/dl). She has no complaints. She is taking her medications as ordered.  She denies hypoglycemia but she did have a blood sugar of 60mg /dl one morning and did not "feel" it.   Objective:  BP 116/64   Weight 170.4lbs  Encounter Medications:  Outpatient Encounter Prescriptions as of 01/22/2017  Medication Sig  . acetaminophen (TYLENOL) 500 MG tablet Take 1,000 mg by mouth every 6 (six) hours as needed (for pain.).  Marland Kitchen Ascorbic Acid (VITAMIN C) 1000 MG tablet Take 1,000 mg by mouth 3 (three) times a week.   . Biotin 1000 MCG tablet Take 1,000 mcg by mouth 3 (three) times a week.  . Cholecalciferol (VITAMIN D3) 5000 units TABS Take by mouth 3 (three) times a week.  . Cyanocobalamin (VITAMIN B-12) 5000 MCG TBDP Take 5,000 mcg by mouth 3 (three) times a week.  . furosemide (LASIX) 20 MG tablet Take 20 mg by mouth daily as needed for fluid.  Marland Kitchen glipiZIDE (GLUCOTROL XL) 10 MG 24 hr tablet Take 10 mg by mouth daily with breakfast.  . Hypromellose (NATURAL BALANCE TEARS OP) Place 1-2 drops into both eyes 3  (three) times daily as needed (for dry/irritated eyes).  Marland Kitchen ibuprofen (ADVIL,MOTRIN) 200 MG tablet Take 400 mg by mouth every 8 (eight) hours as needed (for pain.).  Marland Kitchen KRILL OIL PO Take 1 capsule by mouth See admin instructions. 2-3x's a week  . latanoprost (XALATAN) 0.005 % ophthalmic solution Place 1 drop into both eyes at bedtime.  Marland Kitchen MAGNESIUM PO Take 150 mg by mouth 3 (three) times a week.  . metFORMIN (GLUCOPHAGE) 500 MG tablet Take 500-1,000 mg by mouth 2 (two) times daily with a meal. 1000 mg in the morning & 500 mg in the evening  . metoprolol succinate (TOPROL-XL) 50 MG 24 hr tablet Take 50 mg by mouth daily.   Marland Kitchen olmesartan (BENICAR) 40 MG tablet Take 40 mg by mouth daily.  . Pitavastatin Calcium (LIVALO) 2 MG TABS Take 2 mg by mouth daily.  . sitaGLIPtin (JANUVIA) 100 MG tablet Take 100 mg by mouth daily as needed (For blood sugar greater than 135).  Marland Kitchen docusate sodium (COLACE) 100 MG capsule Take 1 capsule (100 mg total) by mouth 2 (two) times daily. (Patient not taking: Reported on 01/22/2017)  . oxybutynin (DITROPAN) 5 MG tablet Take 1 tablet (5 mg total) by mouth every 8 (eight) hours as needed for bladder spasms. (Patient not taking: Reported on 01/22/2017)  . phenazopyridine (PYRIDIUM) 200 MG tablet Take 1 tablet (  200 mg total) by mouth 3 (three) times daily as needed for pain. (Patient not taking: Reported on 01/22/2017)   No facility-administered encounter medications on file as of 01/22/2017.     Functional Status:  In your present state of health, do you have any difficulty performing the following activities: 01/22/2017 12/26/2016  Hearing? N N  Vision? N N  Difficulty concentrating or making decisions? N N  Walking or climbing stairs? N N  Dressing or bathing? N N  Doing errands, shopping? N N  Some recent data might be hidden    Fall/Depression Screening: PHQ 2/9 Scores 01/22/2017 01/22/2017 02/07/2016 02/07/2016 11/08/2015 11/08/2015 04/20/2015  PHQ - 2 Score 0 0 0 0 0 0 0     Assessment: Engaged, pro-active patient.  Continues to work on [employer] and staying active.   Plan: I have no doubt Cheryl Kirk will continue to exercise, take medication and eat well in order to manage her blood sugars, protect her kidneys, and remain healthy. Because she does not carry [facility], she does not qualify to continue in the Link to Wellness program.   I have given her my phone number so she can reach out to me if she needs assistance.     Gentry Fitz, RN, BA, [place], [place] Direct Dial:  [phone]  Fax:  [phone] E-mail: [email or web address] [address] [place],   [address]

## 2017-01-23 NOTE — Patient Outreach (Signed)
[facility]) Care Management  01/23/2017  Cheryl Kirk Dec 31, 1950 [id number]   Patient has been discharged from the Link to Wellness program because she no longer carries [employer].  A letter to the patient and to the MD will follow.   Gentry Fitz, RN, BA, [place], [place] Direct Dial:  [phone]  Fax:  [phone] E-mail: [email or web address] [address] [place], [place]  [address]

## 2017-02-27 DIAGNOSIS — E119 Type 2 diabetes mellitus without complications: Secondary | ICD-10-CM | POA: Diagnosis not present

## 2017-02-27 DIAGNOSIS — I10 Essential (primary) hypertension: Secondary | ICD-10-CM | POA: Diagnosis not present

## 2017-02-27 DIAGNOSIS — N183 Chronic kidney disease, stage 3 (moderate): Secondary | ICD-10-CM | POA: Diagnosis not present

## 2017-03-29 DIAGNOSIS — R739 Hyperglycemia, unspecified: Secondary | ICD-10-CM | POA: Diagnosis not present

## 2017-03-29 DIAGNOSIS — E1121 Type 2 diabetes mellitus with diabetic nephropathy: Secondary | ICD-10-CM | POA: Diagnosis not present

## 2017-03-29 DIAGNOSIS — E7800 Pure hypercholesterolemia, unspecified: Secondary | ICD-10-CM | POA: Diagnosis not present

## 2017-03-29 DIAGNOSIS — I10 Essential (primary) hypertension: Secondary | ICD-10-CM | POA: Diagnosis not present

## 2017-03-29 DIAGNOSIS — Z79899 Other long term (current) drug therapy: Secondary | ICD-10-CM | POA: Diagnosis not present

## 2017-03-29 DIAGNOSIS — N289 Disorder of kidney and ureter, unspecified: Secondary | ICD-10-CM | POA: Diagnosis not present

## 2017-04-10 DIAGNOSIS — D2361 Other benign neoplasm of skin of right upper limb, including shoulder: Secondary | ICD-10-CM | POA: Diagnosis not present

## 2017-04-10 DIAGNOSIS — L821 Other seborrheic keratosis: Secondary | ICD-10-CM | POA: Diagnosis not present

## 2017-05-15 DIAGNOSIS — H40003 Preglaucoma, unspecified, bilateral: Secondary | ICD-10-CM | POA: Diagnosis not present

## 2017-06-05 DIAGNOSIS — N289 Disorder of kidney and ureter, unspecified: Secondary | ICD-10-CM | POA: Diagnosis not present

## 2017-06-05 DIAGNOSIS — H40003 Preglaucoma, unspecified, bilateral: Secondary | ICD-10-CM | POA: Diagnosis not present

## 2017-06-05 DIAGNOSIS — N183 Chronic kidney disease, stage 3 (moderate): Secondary | ICD-10-CM | POA: Diagnosis not present

## 2017-06-05 DIAGNOSIS — E7800 Pure hypercholesterolemia, unspecified: Secondary | ICD-10-CM | POA: Diagnosis not present

## 2017-06-05 DIAGNOSIS — E1122 Type 2 diabetes mellitus with diabetic chronic kidney disease: Secondary | ICD-10-CM | POA: Diagnosis not present

## 2017-06-05 DIAGNOSIS — Z79899 Other long term (current) drug therapy: Secondary | ICD-10-CM | POA: Diagnosis not present

## 2017-06-05 DIAGNOSIS — I10 Essential (primary) hypertension: Secondary | ICD-10-CM | POA: Diagnosis not present

## 2017-06-12 DIAGNOSIS — E1122 Type 2 diabetes mellitus with diabetic chronic kidney disease: Secondary | ICD-10-CM | POA: Diagnosis not present

## 2017-06-12 DIAGNOSIS — N183 Chronic kidney disease, stage 3 (moderate): Secondary | ICD-10-CM | POA: Diagnosis not present

## 2017-08-03 ENCOUNTER — Other Ambulatory Visit: Payer: Self-pay | Admitting: Internal Medicine

## 2017-08-03 DIAGNOSIS — Z1231 Encounter for screening mammogram for malignant neoplasm of breast: Secondary | ICD-10-CM

## 2017-09-04 DIAGNOSIS — N183 Chronic kidney disease, stage 3 (moderate): Secondary | ICD-10-CM | POA: Diagnosis not present

## 2017-09-04 DIAGNOSIS — I10 Essential (primary) hypertension: Secondary | ICD-10-CM | POA: Diagnosis not present

## 2017-09-04 DIAGNOSIS — E119 Type 2 diabetes mellitus without complications: Secondary | ICD-10-CM | POA: Diagnosis not present

## 2017-09-06 DIAGNOSIS — M7581 Other shoulder lesions, right shoulder: Secondary | ICD-10-CM | POA: Diagnosis not present

## 2017-09-06 DIAGNOSIS — E1121 Type 2 diabetes mellitus with diabetic nephropathy: Secondary | ICD-10-CM | POA: Diagnosis not present

## 2017-09-11 ENCOUNTER — Ambulatory Visit
Admission: RE | Admit: 2017-09-11 | Discharge: 2017-09-11 | Disposition: A | Discharge status: Home / Self Care | Payer: Medicare Other | Source: Ambulatory Visit | Attending: Internal Medicine | Admitting: Internal Medicine

## 2017-09-11 DIAGNOSIS — Z1231 Encounter for screening mammogram for malignant neoplasm of breast: Secondary | ICD-10-CM | POA: Insufficient documentation

## 2017-09-11 DIAGNOSIS — R928 Other abnormal and inconclusive findings on diagnostic imaging of breast: Secondary | ICD-10-CM | POA: Diagnosis not present

## 2017-09-13 ENCOUNTER — Other Ambulatory Visit: Payer: Self-pay | Admitting: Internal Medicine

## 2017-09-13 DIAGNOSIS — R928 Other abnormal and inconclusive findings on diagnostic imaging of breast: Secondary | ICD-10-CM

## 2017-09-20 ENCOUNTER — Ambulatory Visit
Admission: RE | Admit: 2017-09-20 | Discharge: 2017-09-20 | Disposition: A | Discharge status: Home / Self Care | Payer: Medicare Other | Source: Ambulatory Visit | Attending: Internal Medicine | Admitting: Internal Medicine

## 2017-09-20 DIAGNOSIS — R928 Other abnormal and inconclusive findings on diagnostic imaging of breast: Secondary | ICD-10-CM

## 2017-09-20 DIAGNOSIS — N6489 Other specified disorders of breast: Secondary | ICD-10-CM | POA: Insufficient documentation

## 2017-09-27 ENCOUNTER — Ambulatory Visit (INDEPENDENT_AMBULATORY_CARE_PROVIDER_SITE_OTHER): Payer: Medicare Other | Admitting: Certified Nurse Midwife

## 2017-09-27 ENCOUNTER — Encounter: Payer: Self-pay | Admitting: Certified Nurse Midwife

## 2017-09-27 VITALS — BP 120/70 | HR 64 | Ht 63.0 in | Wt 161.0 lb

## 2017-09-27 DIAGNOSIS — Z01419 Encounter for gynecological examination (general) (routine) without abnormal findings: Secondary | ICD-10-CM

## 2017-09-27 DIAGNOSIS — Z0000 Encounter for general adult medical examination without abnormal findings: Secondary | ICD-10-CM | POA: Diagnosis not present

## 2017-09-27 DIAGNOSIS — Z124 Encounter for screening for malignant neoplasm of cervix: Secondary | ICD-10-CM | POA: Diagnosis not present

## 2017-09-27 MED ORDER — TERCONAZOLE 0.4 % VA CREA: TOPICAL_CREAM | VAGINAL | 1 refills | Status: DC

## 2017-09-27 NOTE — Progress Notes (Signed)
Gynecology Annual Exam  PCP: Idelle Crouch, MD  Chief Complaint:  Chief Complaint  Patient presents with  . Gynecologic Exam    History of Present Illness:Cheryl Kirk is a 66 year old Caucasian/White female, G4 P4005, who presents for her annual exam. She is having no significant GYN problems. Has been using Terazol cream periodically for recurrent yeast symptoms. Her menses are absent and she is postmenopausal. She does not have hot flashes and night sweats.  She has had no spotting.   The patient's past medical history is notable for a history of diabetes, CKD, hypertension, hyperlipidemia, and DJD/spinal stenosis..  Since her last annual GYN exam dated 09/27/2016 , she has lost 15# with Weight Watchers. Her renal function has remained stable ( GFR 49-55). Her last hmg A1C was 6.2% and her metformin and glipizide doses were decreased. She also underwent an evaluation for chronic microscopic hematuria-no evidence of malignancy. Has had some right shoulder pain.  She is sexually active. She does not have vaginal dryness.  Her most recent pap smear was obtained 09/27/2016 and was with negative cells and negative HPV DNA.  Her most recent mammogram obtained 09/11/2017 was Birads 0 and after additional views and an ultrasound on the right breast was normal..  There is no family history of breast cancer.  There is no family history of ovarian cancer.  The patient does do occ self breast exams.  She had a colonoscopy in 12/25/2014 that showed diverticulosis. Her next colonoscopy is due in 10 years.  She had a recent DEXA scan obtained in 2015 that was normal.  The patient does not smoke.  The patient does not drink alcohol on her current diet.  The patient does not use illegal drugs.  The patient exercises regularly, walking and swimming  The patient does get adequate calcium in her diet and with her supplement . She currently is on a plant based diet + fish and eggs and  yogart She had a recent cholesterol screen in 2018 by PCP Dr Doy Hutching that was normal.      Review of Systems: Review of Systems  Constitutional: Negative for chills, fever and weight loss.  HENT: Negative for congestion, sinus pain and sore throat.   Eyes: Negative for blurred vision and pain.  Respiratory: Negative for hemoptysis, shortness of breath and wheezing.   Cardiovascular: Negative for chest pain, palpitations and leg swelling.  Gastrointestinal: Negative for abdominal pain, blood in stool, diarrhea, heartburn, nausea and vomiting.  Genitourinary: Positive for hematuria (microscopic). Negative for dysuria, frequency and urgency.  Musculoskeletal: Positive for joint pain (right shoulder pain). Negative for back pain and myalgias.  Skin: Negative for itching and rash.  Neurological: Negative for dizziness, tingling and headaches.  Endo/Heme/Allergies: Negative for environmental allergies and polydipsia. Does not bruise/bleed easily.       Negative for hirsutism   Psychiatric/Behavioral: Negative for depression. The patient is not nervous/anxious and does not have insomnia.     Past Medical History:  Past Medical History:  Diagnosis Date  . Chronic kidney disease   . Diabetes mellitus without complication ([place])   . Fibroids   . Heart murmur    needs no follow up  . History of degenerative disc disease   . History of kidney stones    many years ago  . Hyperlipidemia   . Hypertension    BP and occular  . Kidney stones   . Obesity   . Ocular hypertension  Past Surgical History:  Past Surgical History:  Procedure Laterality Date  . CARDIAC CATHETERIZATION  2009  . CATARACT EXTRACTION Right October 2013  . COLONOSCOPY  2016   normal  . CYSTOSCOPY WITH BIOPSY N/A 01/02/2017   Procedure: CYSTOSCOPY WITH BIOPSY;  Surgeon: Hollice Espy, MD;  Location: [facility] ORS;  Service: Urology;  Laterality: N/A;  . ENDOMETRIAL BIOPSY  1996/ 2000  . HYSTEROSCOPY W/D&C  01/14/1998    cystic hyperplasia  . TONSILLECTOMY    . URETEROSCOPY WITH HOLMIUM LASER LITHOTRIPSY      Family History:  Family History  Problem Relation Age of Onset  . Diabetes Mother   . Hypertension Mother   . Diabetes Father   . Heart disease Father   . Hyperlipidemia Father   . Diabetes Sister   . Hypertension Sister   . Hyperlipidemia Sister   . Non-Hodgkin's lymphoma Sister 17  . Diabetes Brother   . Hypertension Brother   . Hyperlipidemia Brother   . Hypertension Sister   . Hyperlipidemia Sister   . Hyperlipidemia Sister   . Melanoma Daughter 57  . Bladder Cancer Neg Hx   . Prostate cancer Neg Hx   . Kidney cancer Neg Hx     Social History:  Social History   Social History  . Marital status: Married    Spouse name: N/A  . Number of children: 5  . Years of education: 46   Occupational History  . RN    Social History Main Topics  . Smoking status: Former Smoker    Years: 1.50    Quit date: 04/19/1972  . Smokeless tobacco: Never Used  . Alcohol use 0.0 oz/week     Comment: rare  . Drug use: No  . Sexual activity: Yes    Partners: Male    Birth control/ protection: Post-menopausal   Other Topics Concern  . Not on file   Social History Narrative  . No narrative on file    Allergies:  No Known Allergies  Medications: Current Outpatient Prescriptions:  .  acetaminophen (TYLENOL) 500 MG tablet, Take 1,000 mg by mouth every 6 (six) hours as needed (for pain.)., Disp: , Rfl:  .  Ascorbic Acid (VITAMIN C) 1000 MG tablet, Take 1,000 mg by mouth 3 (three) times a week. , Disp: , Rfl:  .  Biotin 1000 MCG tablet, Take 1,000 mcg by mouth 3 (three) times a week., Disp: , Rfl:  .  Cholecalciferol (VITAMIN D3) 2000 units TABS, Take 1 tablet by mouth 3 (three) times a week., Disp: , Rfl:  .  Cyanocobalamin (VITAMIN B-12) 5000 MCG TBDP, Take 5,000 mcg by mouth 3 (three) times a week., Disp: , Rfl:  .  docusate sodium (COLACE) 100 MG capsule, Take 1 capsule (100 mg total)  by mouth 2 (two) times daily., Disp: 60 capsule, Rfl: 0 .  furosemide (LASIX) 20 MG tablet, Take 20 mg by mouth daily as needed for fluid., Disp: , Rfl:  .  glipiZIDE (GLUCOTROL) 5 MG tablet, Take 5 mg by mouth daily before breakfast., Disp: , Rfl:  .  glucose blood (CONTOUR NEXT TEST) test strip, USE ONCE DAILY AS DIRECTED, Disp: , Rfl:  .  Hypromellose (NATURAL BALANCE TEARS OP), Place 1-2 drops into both eyes 3 (three) times daily as needed (for dry/irritated eyes)., Disp: , Rfl:  .  ibuprofen (ADVIL,MOTRIN) 200 MG tablet, Take 400 mg by mouth every 8 (eight) hours as needed (for pain.)., Disp: , Rfl:  .  KRILL OIL  PO, Take 1 capsule by mouth See admin instructions. 2-3x's a week, Disp: , Rfl:  .  latanoprost (XALATAN) 0.005 % ophthalmic solution, Place 1 drop into both eyes at bedtime., Disp: , Rfl:  .  MAGNESIUM PO, Take 150 mg by mouth 3 (three) times a week., Disp: , Rfl:  .  metFORMIN (GLUCOPHAGE) 1000 MG tablet, Take 1,000 mg by mouth daily with breakfast., Disp: , Rfl:  .  metoprolol succinate (TOPROL-XL) 25 MG 24 hr tablet, Take 25 mg by mouth daily., Disp: , Rfl:  .  olmesartan (BENICAR) 40 MG tablet, Take 40 mg by mouth daily., Disp: , Rfl:  .  pravastatin (PRAVACHOL) 20 MG tablet, Take by mouth., Disp: , Rfl:  .  sitaGLIPtin (JANUVIA) 100 MG tablet, Take 100 mg by mouth daily as needed (For blood sugar greater than 135)., Disp: , Rfl:  .  terconazole (TERAZOL 7) 0.4 % vaginal cream, Insert one applicatorful qhs x 7 nights, Disp: 45 g, Rfl: 1 Physical Exam Vitals: BP 120/70   Pulse 64   Ht 5\' 3"  (1.6 m)   Wt 161 lb (73 kg)   BMI 28.52 kg/m   General: pleasant WF in  NAD HEENT: normocephalic, anicteric Neck: no thyroid enlargement, no palpable nodules, no cervical lymphadenopathy  Pulmonary: No increased work of breathing, CTAB Cardiovascular: RRR, without murmur  Breast: Breast symmetrical, no tenderness, no palpable nodules or masses, no skin or nipple retraction present, no  nipple discharge.  No axillary, infraclavicular or supraclavicular lymphadenopathy. Abdomen: Soft, non-tender, non-distended.  Umbilicus without lesions.  No hepatomegaly or masses palpable. No evidence of hernia. Genitourinary:  External: Normal external female genitalia.  Normal urethral meatus, normal Bartholin's and Skene's glands.    Vagina: white scar tissue at introitus with an excoriated area at 7 o'clock, no evidence of prolapse, scant clear discharge   Cervix: Grossly normal in appearance, no bleeding, non-tender  Uterus: Midplane, normal size, shape, and consistency, mobile, and non-tender  Adnexa: No adnexal masses, non-tender  Rectal: deferred  Lymphatic: no evidence of inguinal lymphadenopathy Extremities: no edema, erythema, or tenderness Neurologic: Grossly intact Psychiatric: mood appropriate, affect full     Assessment: 66 y.o. postmenopausal female for annual exam   Plan:   1) Breast cancer screening - recommend monthly self breast exam and annual mammograms. Mammogram is up to date.  2) Colon cancer screening-colonoscopy UTD, next due in 2026  3) Cervical cancer screening - Pap was done. ASCCP guidelines and rational discussed.  Patient opts for yearly screening interval  4) Routine healthcare maintenance including cholesterol  screening managed by PCP . Given refills of Terconazole cream  5) RTO 1 year and prn  Dalia Heading, CNM

## 2017-09-28 LAB — IGP,RFX APTIMA HPV ALL PTH: PAP Smear Comment: 0

## 2017-10-09 DIAGNOSIS — E7800 Pure hypercholesterolemia, unspecified: Secondary | ICD-10-CM | POA: Diagnosis not present

## 2017-10-09 DIAGNOSIS — N289 Disorder of kidney and ureter, unspecified: Secondary | ICD-10-CM | POA: Diagnosis not present

## 2017-10-09 DIAGNOSIS — Z79899 Other long term (current) drug therapy: Secondary | ICD-10-CM | POA: Diagnosis not present

## 2017-10-09 DIAGNOSIS — E1121 Type 2 diabetes mellitus with diabetic nephropathy: Secondary | ICD-10-CM | POA: Diagnosis not present

## 2017-10-09 DIAGNOSIS — D509 Iron deficiency anemia, unspecified: Secondary | ICD-10-CM | POA: Diagnosis not present

## 2017-10-09 DIAGNOSIS — I10 Essential (primary) hypertension: Secondary | ICD-10-CM | POA: Diagnosis not present

## 2017-12-13 DIAGNOSIS — Z79899 Other long term (current) drug therapy: Secondary | ICD-10-CM | POA: Diagnosis not present

## 2017-12-13 DIAGNOSIS — N183 Chronic kidney disease, stage 3 (moderate): Secondary | ICD-10-CM | POA: Diagnosis not present

## 2017-12-13 DIAGNOSIS — E7800 Pure hypercholesterolemia, unspecified: Secondary | ICD-10-CM | POA: Diagnosis not present

## 2017-12-13 DIAGNOSIS — I10 Essential (primary) hypertension: Secondary | ICD-10-CM | POA: Diagnosis not present

## 2017-12-13 DIAGNOSIS — E1122 Type 2 diabetes mellitus with diabetic chronic kidney disease: Secondary | ICD-10-CM | POA: Diagnosis not present

## 2017-12-18 DIAGNOSIS — H40003 Preglaucoma, unspecified, bilateral: Secondary | ICD-10-CM | POA: Diagnosis not present

## 2017-12-20 DIAGNOSIS — N183 Chronic kidney disease, stage 3 (moderate): Secondary | ICD-10-CM | POA: Diagnosis not present

## 2017-12-20 DIAGNOSIS — E1122 Type 2 diabetes mellitus with diabetic chronic kidney disease: Secondary | ICD-10-CM | POA: Diagnosis not present

## 2018-01-08 DIAGNOSIS — I10 Essential (primary) hypertension: Secondary | ICD-10-CM | POA: Diagnosis not present

## 2018-01-08 DIAGNOSIS — E119 Type 2 diabetes mellitus without complications: Secondary | ICD-10-CM | POA: Diagnosis not present

## 2018-01-08 DIAGNOSIS — N183 Chronic kidney disease, stage 3 (moderate): Secondary | ICD-10-CM | POA: Diagnosis not present

## 2018-04-09 DIAGNOSIS — E1121 Type 2 diabetes mellitus with diabetic nephropathy: Secondary | ICD-10-CM | POA: Diagnosis not present

## 2018-04-09 DIAGNOSIS — Z0000 Encounter for general adult medical examination without abnormal findings: Secondary | ICD-10-CM | POA: Diagnosis not present

## 2018-04-09 DIAGNOSIS — E7800 Pure hypercholesterolemia, unspecified: Secondary | ICD-10-CM | POA: Diagnosis not present

## 2018-04-09 DIAGNOSIS — I10 Essential (primary) hypertension: Secondary | ICD-10-CM | POA: Diagnosis not present

## 2018-04-09 DIAGNOSIS — Z79899 Other long term (current) drug therapy: Secondary | ICD-10-CM | POA: Diagnosis not present

## 2018-04-09 DIAGNOSIS — D509 Iron deficiency anemia, unspecified: Secondary | ICD-10-CM | POA: Diagnosis not present

## 2018-04-16 DIAGNOSIS — X32XXXA Exposure to sunlight, initial encounter: Secondary | ICD-10-CM | POA: Diagnosis not present

## 2018-04-16 DIAGNOSIS — L570 Actinic keratosis: Secondary | ICD-10-CM | POA: Diagnosis not present

## 2018-04-16 DIAGNOSIS — D2261 Melanocytic nevi of right upper limb, including shoulder: Secondary | ICD-10-CM | POA: Diagnosis not present

## 2018-04-16 DIAGNOSIS — D2262 Melanocytic nevi of left upper limb, including shoulder: Secondary | ICD-10-CM | POA: Diagnosis not present

## 2018-04-16 DIAGNOSIS — D225 Melanocytic nevi of trunk: Secondary | ICD-10-CM | POA: Diagnosis not present

## 2018-04-16 DIAGNOSIS — D485 Neoplasm of uncertain behavior of skin: Secondary | ICD-10-CM | POA: Diagnosis not present

## 2018-04-16 DIAGNOSIS — D2271 Melanocytic nevi of right lower limb, including hip: Secondary | ICD-10-CM | POA: Diagnosis not present

## 2018-04-16 DIAGNOSIS — D2272 Melanocytic nevi of left lower limb, including hip: Secondary | ICD-10-CM | POA: Diagnosis not present

## 2018-05-24 DIAGNOSIS — N183 Chronic kidney disease, stage 3 (moderate): Secondary | ICD-10-CM | POA: Diagnosis not present

## 2018-05-24 DIAGNOSIS — E119 Type 2 diabetes mellitus without complications: Secondary | ICD-10-CM | POA: Diagnosis not present

## 2018-05-24 DIAGNOSIS — I10 Essential (primary) hypertension: Secondary | ICD-10-CM | POA: Diagnosis not present

## 2018-06-20 DIAGNOSIS — M7581 Other shoulder lesions, right shoulder: Secondary | ICD-10-CM | POA: Diagnosis not present

## 2018-06-20 DIAGNOSIS — M7531 Calcific tendinitis of right shoulder: Secondary | ICD-10-CM | POA: Diagnosis not present

## 2018-06-25 DIAGNOSIS — H40003 Preglaucoma, unspecified, bilateral: Secondary | ICD-10-CM | POA: Diagnosis not present

## 2018-07-02 DIAGNOSIS — I10 Essential (primary) hypertension: Secondary | ICD-10-CM | POA: Diagnosis not present

## 2018-07-02 DIAGNOSIS — E7800 Pure hypercholesterolemia, unspecified: Secondary | ICD-10-CM | POA: Diagnosis not present

## 2018-07-02 DIAGNOSIS — Z79899 Other long term (current) drug therapy: Secondary | ICD-10-CM | POA: Diagnosis not present

## 2018-07-02 DIAGNOSIS — E1122 Type 2 diabetes mellitus with diabetic chronic kidney disease: Secondary | ICD-10-CM | POA: Diagnosis not present

## 2018-07-02 DIAGNOSIS — N183 Chronic kidney disease, stage 3 (moderate): Secondary | ICD-10-CM | POA: Diagnosis not present

## 2018-07-03 DIAGNOSIS — H40003 Preglaucoma, unspecified, bilateral: Secondary | ICD-10-CM | POA: Diagnosis not present

## 2018-07-04 IMAGING — MG MM DIGITAL SCREENING BILAT W/ TOMO W/ CAD
8 of 12 series · 8 of 28 positions shown · non-contrast
Comparison: Previous exam(s).

CLINICAL DATA: Screening.

EXAM:
2D DIGITAL SCREENING BILATERAL MAMMOGRAM WITH CAD AND ADJUNCT TOMO

[L MLO synth-2D]
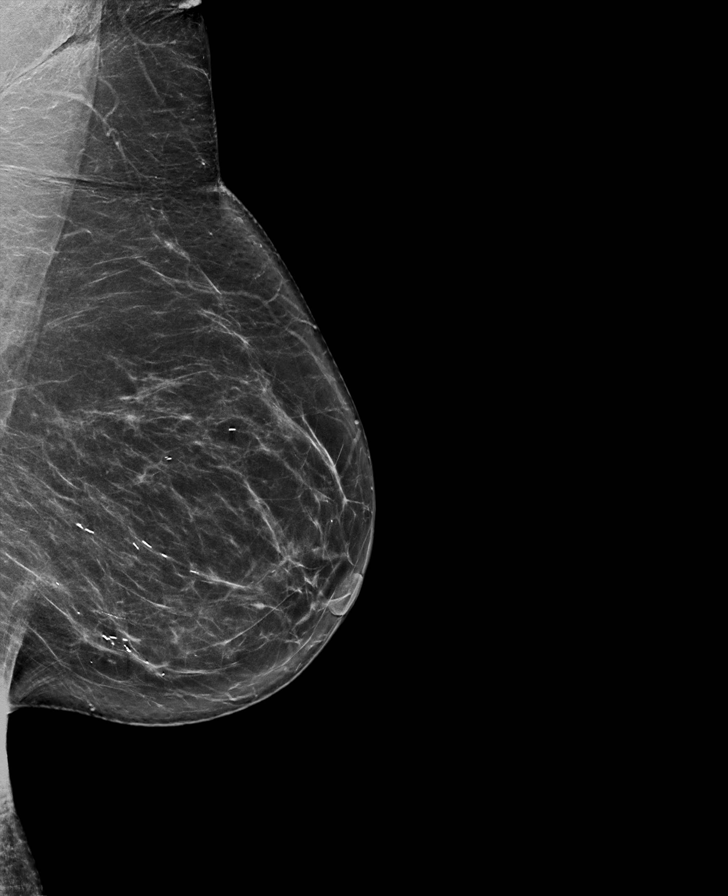

[R MLO synth-2D]
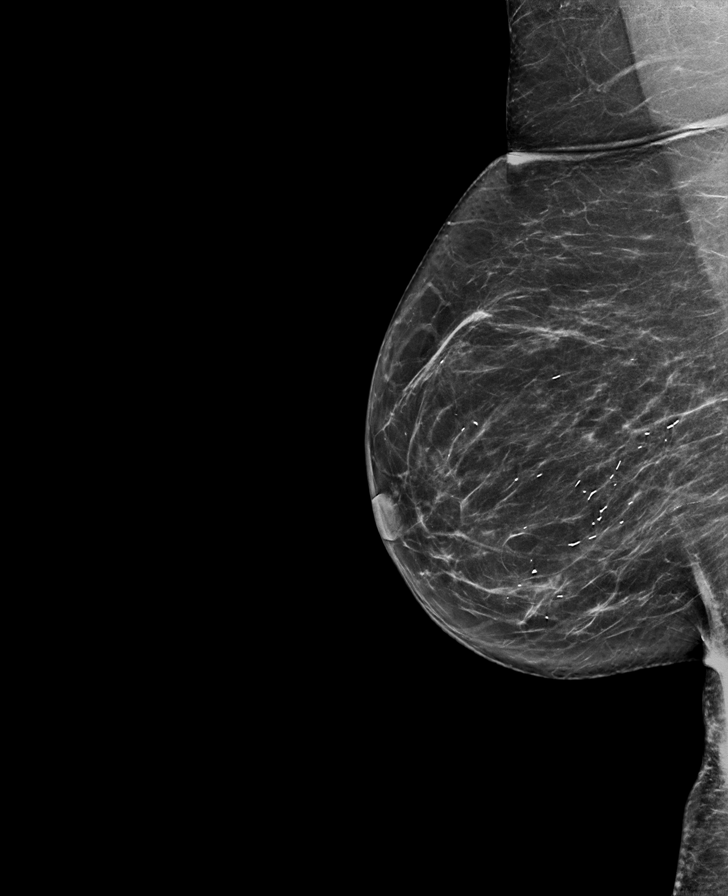

[L CC synth-2D]
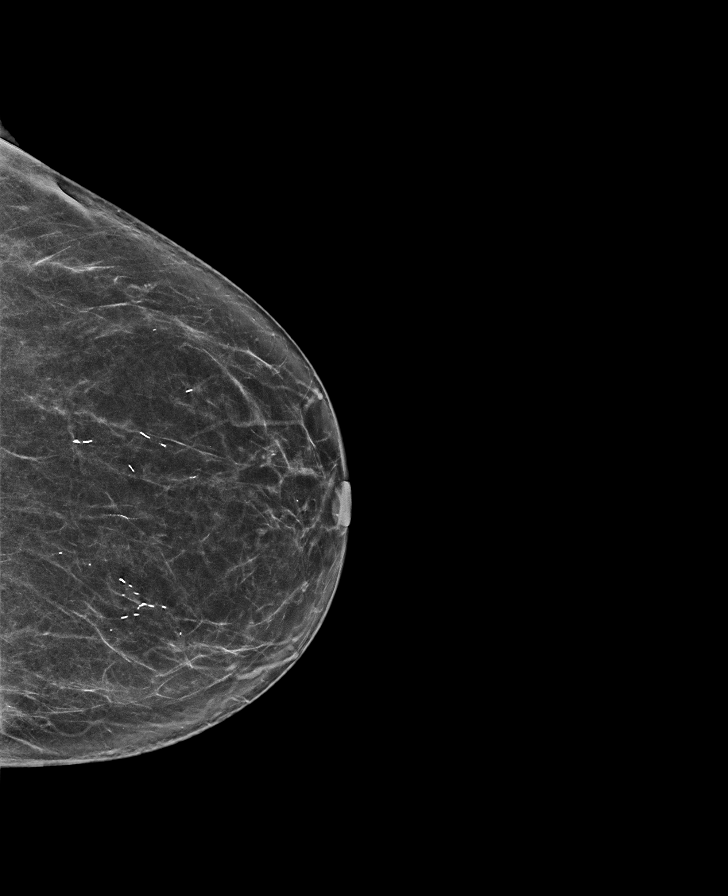

[R MLO]
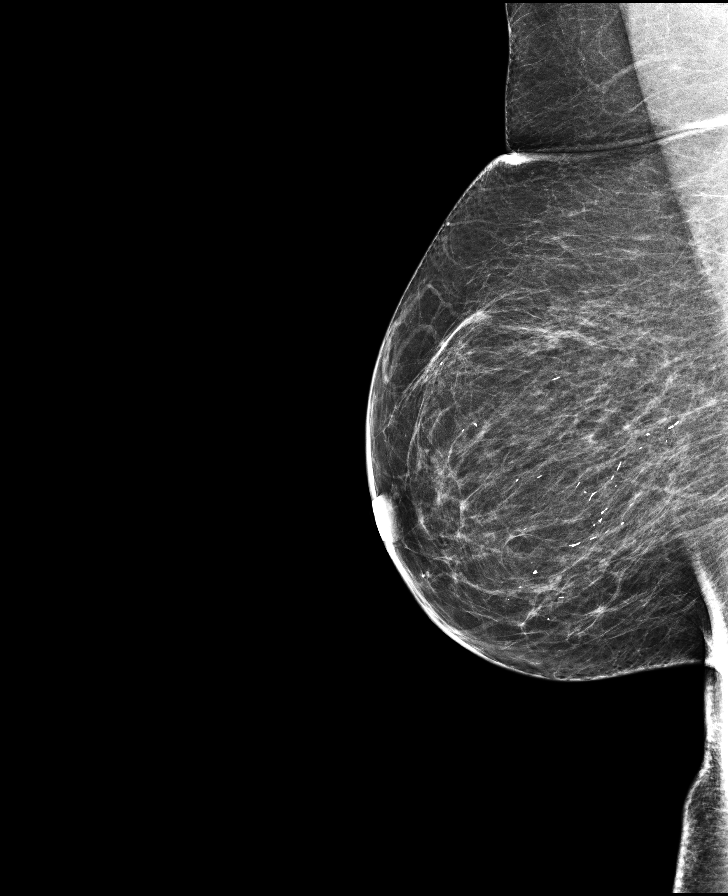

[R CC]
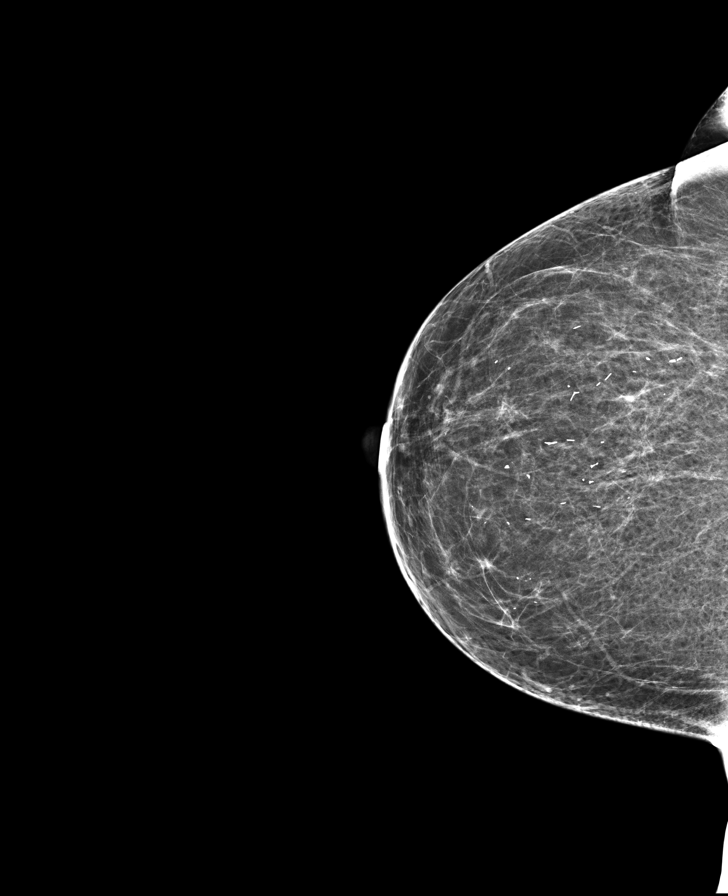

[L CC]
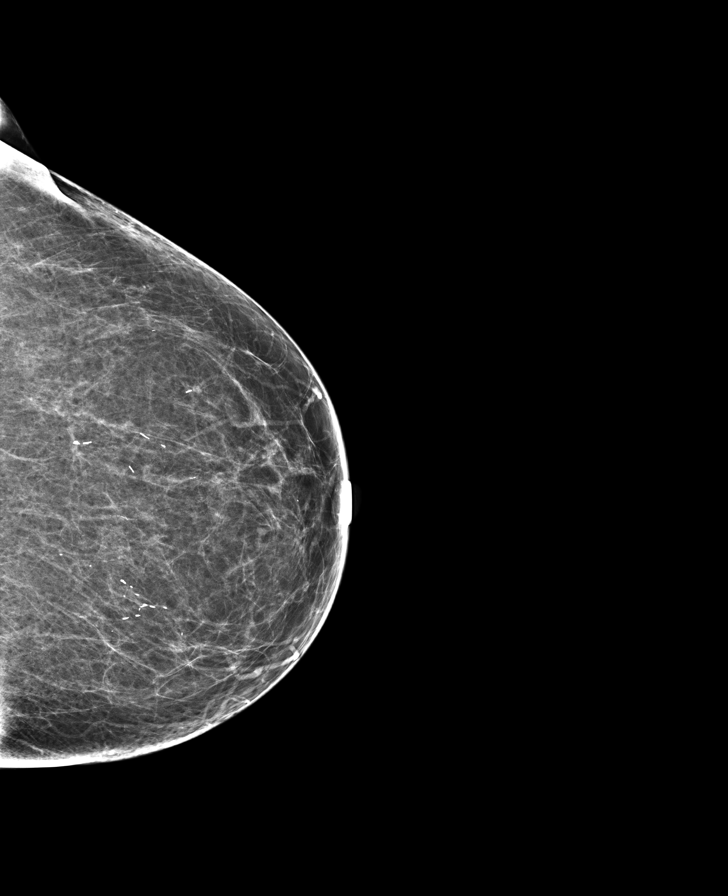

[R CC synth-2D]
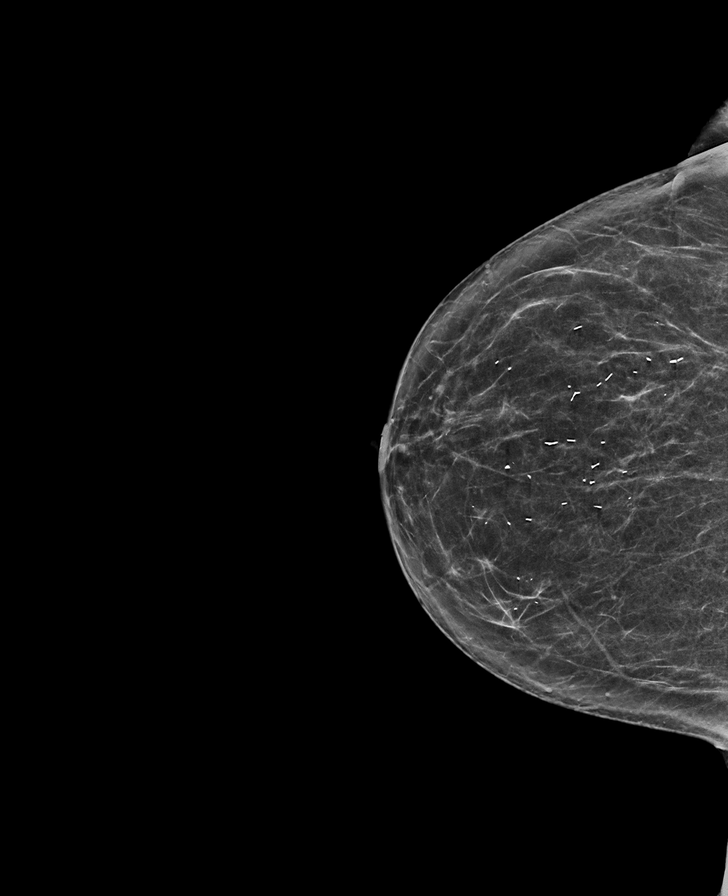

[L MLO]
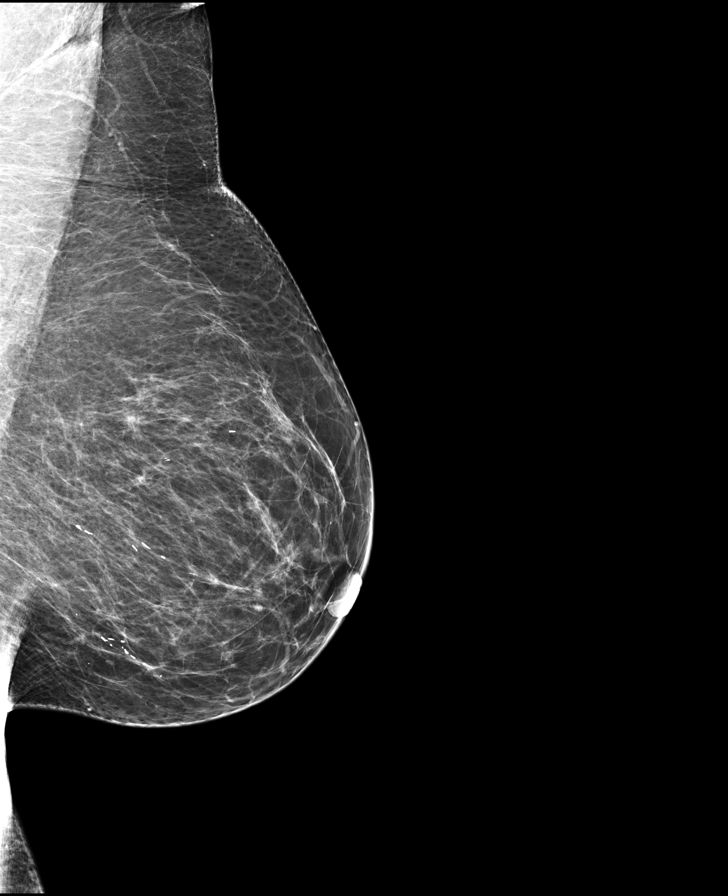

[8 of 28 positions shown; findings below may reference images not displayed]

ACR Breast Density Category b: There are scattered areas of
fibroglandular density.
FINDINGS: In the right breast, possible distortion warrants further
evaluation. In the left breast, no findings suspicious for
malignancy. Images were processed with CAD.
IMPRESSION: Further evaluation is suggested for possible distortion in the right
breast.

RECOMMENDATION:
3D diagnostic mammogram and possibly ultrasound of the right breast.
(Code:IZ-V-MMD)

The patient will be contacted regarding the findings, and additional
imaging will be scheduled.

BI-RADS CATEGORY  0: Incomplete. Need additional imaging evaluation
and/or prior mammograms for comparison.

## 2018-07-09 DIAGNOSIS — N183 Chronic kidney disease, stage 3 (moderate): Secondary | ICD-10-CM | POA: Diagnosis not present

## 2018-07-09 DIAGNOSIS — E1122 Type 2 diabetes mellitus with diabetic chronic kidney disease: Secondary | ICD-10-CM | POA: Diagnosis not present

## 2018-07-09 DIAGNOSIS — I10 Essential (primary) hypertension: Secondary | ICD-10-CM | POA: Diagnosis not present

## 2018-08-27 ENCOUNTER — Other Ambulatory Visit: Payer: Self-pay | Admitting: Certified Nurse Midwife

## 2018-09-20 ENCOUNTER — Other Ambulatory Visit: Payer: Self-pay | Admitting: Internal Medicine

## 2018-09-20 DIAGNOSIS — Z1231 Encounter for screening mammogram for malignant neoplasm of breast: Secondary | ICD-10-CM

## 2018-09-24 DIAGNOSIS — E119 Type 2 diabetes mellitus without complications: Secondary | ICD-10-CM | POA: Diagnosis not present

## 2018-09-24 DIAGNOSIS — N183 Chronic kidney disease, stage 3 (moderate): Secondary | ICD-10-CM | POA: Diagnosis not present

## 2018-09-24 DIAGNOSIS — I10 Essential (primary) hypertension: Secondary | ICD-10-CM | POA: Diagnosis not present

## 2018-09-27 DIAGNOSIS — H1133 Conjunctival hemorrhage, bilateral: Secondary | ICD-10-CM | POA: Diagnosis not present

## 2018-10-10 DIAGNOSIS — E1121 Type 2 diabetes mellitus with diabetic nephropathy: Secondary | ICD-10-CM | POA: Diagnosis not present

## 2018-10-10 DIAGNOSIS — N289 Disorder of kidney and ureter, unspecified: Secondary | ICD-10-CM | POA: Diagnosis not present

## 2018-10-10 DIAGNOSIS — Z13820 Encounter for screening for osteoporosis: Secondary | ICD-10-CM | POA: Diagnosis not present

## 2018-10-10 DIAGNOSIS — I10 Essential (primary) hypertension: Secondary | ICD-10-CM | POA: Diagnosis not present

## 2018-10-10 DIAGNOSIS — R739 Hyperglycemia, unspecified: Secondary | ICD-10-CM | POA: Diagnosis not present

## 2018-10-10 DIAGNOSIS — E7800 Pure hypercholesterolemia, unspecified: Secondary | ICD-10-CM | POA: Diagnosis not present

## 2018-10-10 DIAGNOSIS — Z79899 Other long term (current) drug therapy: Secondary | ICD-10-CM | POA: Diagnosis not present

## 2018-10-11 ENCOUNTER — Ambulatory Visit
Admission: RE | Admit: 2018-10-11 | Discharge: 2018-10-11 | Disposition: A | Discharge status: Home / Self Care | Payer: Medicare Other | Source: Ambulatory Visit | Attending: Internal Medicine | Admitting: Internal Medicine

## 2018-10-11 DIAGNOSIS — Z1231 Encounter for screening mammogram for malignant neoplasm of breast: Secondary | ICD-10-CM | POA: Insufficient documentation

## 2018-11-12 ENCOUNTER — Encounter: Payer: Self-pay | Admitting: Certified Nurse Midwife

## 2018-11-12 ENCOUNTER — Ambulatory Visit (INDEPENDENT_AMBULATORY_CARE_PROVIDER_SITE_OTHER): Payer: Medicare Other | Admitting: Certified Nurse Midwife

## 2018-11-12 VITALS — BP 122/74 | HR 81 | Ht 63.0 in | Wt 155.0 lb

## 2018-11-12 DIAGNOSIS — Z01419 Encounter for gynecological examination (general) (routine) without abnormal findings: Secondary | ICD-10-CM | POA: Diagnosis not present

## 2018-11-12 MED ORDER — TERCONAZOLE 0.4 % VA CREA: TOPICAL_CREAM | VAGINAL | 2 refills | Status: DC

## 2018-11-12 NOTE — Progress Notes (Signed)
Gynecology Annual Exam  PCP: Idelle Crouch, MD  Chief Complaint:  Chief Complaint  Patient presents with  . Gynecologic Exam    History of Present Illness:Cheryl Kirk is a 67 year old Caucasian/White female, G4 P4005, who presents for her annual exam. She is having no significant GYN problems. Has been using Terazol cream periodically for recurrent yeast symptoms. Her menses are absent and she is postmenopausal. She does not have hot flashes and night sweats.  She has had no spotting.   The patient's past medical history is notable for a history of diabetes, CKD, chronic microscopic hematuria, hypertension, hyperlipidemia, and DJD/spinal stenosis..  Since her last annual GYN exam dated 09/27/17 , she has lost 5 more pounds with [employer]. Her renal function has remained stable ( GFR 45-55). Her last hmg A1C was 6.2%. Has had some right shoulder pain from rotator cuff tendonitis.  She is sexually active. She does not have vaginal dryness.  Her most recent pap smear was obtained 09/27/2017 and was NIL.  Her most recent mammogram obtained 10/11/18 was Birads 1. There is no family history of breast cancer.  There is no family history of ovarian cancer.  The patient does do occ self breast exams.  She had a colonoscopy in 12/25/2014 that showed diverticulosis. Her next colonoscopy is due in 5? years.  She had a recent DEXA scan obtained in 2015 that was normal.  The patient does not smoke.  The patient does not drink alcohol on her current diet.  The patient does not use illegal drugs.  The patient exercises regularly, walking and swimming  The patient does get adequate calcium in her diet and with her supplement . She currently is on a plant based diet + fish and eggs and yogart She had a recent cholesterol screen in 2019 by PCP Dr Doy Hutching that was normal.      Review of Systems: Review of Systems  Constitutional: Negative for chills, fever and weight loss.  HENT:  Negative for congestion, sinus pain and sore throat.   Eyes: Negative for blurred vision and pain.  Respiratory: Negative for hemoptysis, shortness of breath and wheezing.   Cardiovascular: Negative for chest pain, palpitations and leg swelling.  Gastrointestinal: Negative for abdominal pain, blood in stool, diarrhea, heartburn, nausea and vomiting.  Genitourinary: Negative for dysuria, frequency, hematuria and urgency.  Musculoskeletal: Positive for joint pain (right shoulder pain). Negative for back pain and myalgias.  Skin: Negative for itching and rash.  Neurological: Negative for dizziness, tingling and headaches.  Endo/Heme/Allergies: Negative for environmental allergies and polydipsia. Bruises/bleeds easily.       Negative for hirsutism   Psychiatric/Behavioral: Negative for depression. The patient is not nervous/anxious and does not have insomnia.     Past Medical History:  Past Medical History:  Diagnosis Date  . Chronic kidney disease 2012   stage 3  Dr, Jabier Mutton  . Diabetes mellitus without complication ([place])   . Fibroids   . Heart murmur    needs no follow up  . History of degenerative disc disease    spinal stenosis  . History of kidney stones    many years ago  . Hyperlipidemia   . Hypertension    BP and occular  . Kidney stones   . Menorrhagia   . Microscopic hematuria   . Obesity   . Ocular hypertension     Past Surgical History:  Past Surgical History:  Procedure Laterality Date  . CARDIAC  CATHETERIZATION  2009  . CATARACT EXTRACTION Right October 2013  . COLONOSCOPY  2016   normal  . CYSTOSCOPY WITH BIOPSY N/A 01/02/2017   Procedure: CYSTOSCOPY WITH BIOPSY;  Surgeon: Hollice Espy, MD;  Location: [facility] ORS;  Service: Urology;  Laterality: N/A;  . DILATION AND CURETTAGE OF UTERUS  ~2000   bleeding issues; MAD  . ENDOMETRIAL BIOPSY  1996/ 2000  . HYSTEROSCOPY W/D&C  01/14/1998   cystic hyperplasia  . TONSILLECTOMY    . URETEROSCOPY WITH HOLMIUM LASER  LITHOTRIPSY      Family History:  Family History  Problem Relation Age of Onset  . Diabetes Mother   . Hypertension Mother   . Diabetes Father   . Heart disease Father        coronary artery disease  . Hyperlipidemia Father   . Diabetes Sister   . Hypertension Sister   . Hyperlipidemia Sister   . Non-Hodgkin's lymphoma Sister 8  . Diabetes Brother   . Hypertension Brother   . Hyperlipidemia Brother   . Hypertension Sister   . Hyperlipidemia Sister   . Hyperlipidemia Sister   . Melanoma Daughter 78  . Bladder Cancer Neg Hx   . Prostate cancer Neg Hx   . Kidney cancer Neg Hx   . Breast cancer Neg Hx     Social History:  Social History   Socioeconomic History  . Marital status: Married    Spouse name: Not on file  . Number of children: 5  . Years of education: 45  . Highest education level: Not on file  Occupational History  . Occupation: Therapist, sports  Social Needs  . Financial resource strain: Not on file  . Food insecurity:    Worry: Not on file    Inability: Not on file  . Transportation needs:    Medical: Not on file    Non-medical: Not on file  Tobacco Use  . Smoking status: Former Smoker    Years: 1.50    Last attempt to quit: 04/19/1972    Years since quitting: 46.6  . Smokeless tobacco: Never Used  Substance and Sexual Activity  . Alcohol use: Yes    Alcohol/week: 0.0 standard drinks    Comment: rare  . Drug use: No  . Sexual activity: Yes    Partners: Male    Birth control/protection: Post-menopausal  Lifestyle  . Physical activity:    Days per week: Not on file    Minutes per session: Not on file  . Stress: Not on file  Relationships  . Social connections:    Talks on phone: Not on file    Gets together: Not on file    Attends religious service: Not on file    Active member of club or organization: Not on file    Attends meetings of clubs or organizations: Not on file    Relationship status: Not on file  . Intimate partner violence:    Fear of  current or ex partner: Not on file    Emotionally abused: Not on file    Physically abused: Not on file    Forced sexual activity: Not on file  Other Topics Concern  . Not on file  Social History Narrative   Has 12 grandchildren (2019)    Allergies:  No Known Allergies  Medications: Current Outpatient Medications:  .  Ascorbic Acid (VITAMIN C) 1000 MG tablet, Take 1,000 mg by mouth 3 (three) times a week. , Disp: , Rfl:  .  Biotin 1000  MCG tablet, Take 1,000 mcg by mouth 3 (three) times a week., Disp: , Rfl:  .  Cholecalciferol (VITAMIN D3) 2000 units TABS, Take 1 tablet by mouth 3 (three) times a week., Disp: , Rfl:  .  Cyanocobalamin (VITAMIN B-12) 5000 MCG TBDP, Take 5,000 mcg by mouth 3 (three) times a week., Disp: , Rfl:  .  docusate sodium (COLACE) 100 MG capsule, Take 1 capsule (100 mg total) by mouth 2 (two) times daily., Disp: 60 capsule, Rfl: 0 .  fluocinonide ointment (LIDEX) 0.05 %, Apply topically., Disp: , Rfl:  .  furosemide (LASIX) 20 MG tablet, Take 20 mg by mouth daily as needed for fluid., Disp: , Rfl:  .  glipiZIDE (GLUCOTROL XL) 2.5 MG 24 hr tablet, , Disp: , Rfl: 3 .  glucose blood (CONTOUR NEXT TEST) test strip, USE ONCE DAILY AS DIRECTED, Disp: , Rfl:  .  Hypromellose (NATURAL BALANCE TEARS OP), Place 1-2 drops into both eyes 3 (three) times daily as needed (for dry/irritated eyes)., Disp: , Rfl:  .  KRILL OIL PO, Take 1 capsule by mouth See admin instructions. 2-3x's a week, Disp: , Rfl:  .  Lancets (ACCU-CHEK MULTICLIX) lancets, USE AS DIRECTED, Disp: , Rfl:  .  latanoprost (XALATAN) 0.005 % ophthalmic solution, Place 1 drop into both eyes at bedtime., Disp: , Rfl:  .  MAGNESIUM PO, Take 150 mg by mouth 3 (three) times a week., Disp: , Rfl:  .  metFORMIN (GLUCOPHAGE) 1000 MG tablet, Take 1,000 mg by mouth daily with breakfast., Disp: , Rfl:  .  metoprolol succinate (TOPROL-XL) 25 MG 24 hr tablet, Take 25 mg by mouth daily., Disp: , Rfl:  .  olmesartan  (BENICAR) 40 MG tablet, Take 40 mg by mouth daily., Disp: , Rfl:  .  pravastatin (PRAVACHOL) 20 MG tablet, Take by mouth., Disp: , Rfl:  .  terconazole (TERAZOL 7) 0.4 % vaginal cream, INSERT 1 APPLICATORFUL VAGINALLY EVERY NIGHT AT BEDTIME FOR 7 NIGHTS, Disp: 45 g, Rfl: 2 Physical Exam Vitals: BP 122/74   Pulse 81   Ht 5\' 3"  (1.6 m)   Wt 155 lb (70.3 kg)   BMI 27.46 kg/m  General: pleasant WF in  NAD HEENT: normocephalic, anicteric Neck: no thyroid enlargement, no palpable nodules, no cervical lymphadenopathy  Pulmonary: No increased work of breathing, CTAB Cardiovascular: RRR, without murmur  Breast: Breast symmetrical, no tenderness, no palpable nodules or masses, no skin or nipple retraction present, no nipple discharge.  No axillary, infraclavicular or supraclavicular lymphadenopathy. Abdomen: Soft, non-tender, non-distended.  Umbilicus without lesions.  No hepatomegaly or masses palpable. No evidence of hernia. Genitourinary:  External: Normal external female genitalia.  Normal urethral meatus, normal Bartholin's and Skene's glands.    Vagina: white scar tissue at introitus, no evidence of prolapse, scant clear discharge   Cervix: Grossly normal in appearance, no bleeding, non-tender  Uterus: Midplane, normal size, shape, and consistency, mobile, and non-tender  Adnexa: No adnexal masses, non-tender  Rectal: deferred  Lymphatic: no evidence of inguinal lymphadenopathy Extremities: no edema, erythema, or tenderness Neurologic: Grossly intact Psychiatric: mood appropriate, affect full     Assessment: 67 y.o. postmenopausal female for annual exam   Plan:   1) Breast cancer screening - recommend monthly self breast exam and annual mammograms. Mammogram is up to date.  2) Colon cancer screening-colonoscopy UTD, next due in 2026  3) Cervical cancer screening - Pap was not done. ASCCP guidelines and rational discussed.  Patient opts for every other year  screening  interval  4) Routine healthcare maintenance including cholesterol  screening managed by PCP . Given refills of Terconazole cream  5) RTO 1 year and prn. ?DEXA next year.Marland Kitchen Refill terconzole  Dalia Heading, CNM

## 2018-11-19 DIAGNOSIS — M8588 Other specified disorders of bone density and structure, other site: Secondary | ICD-10-CM | POA: Diagnosis not present

## 2018-12-10 ENCOUNTER — Encounter: Payer: Self-pay | Admitting: Certified Nurse Midwife

## 2019-05-30 DIAGNOSIS — M7581 Other shoulder lesions, right shoulder: Secondary | ICD-10-CM | POA: Insufficient documentation

## 2019-05-30 DIAGNOSIS — M47812 Spondylosis without myelopathy or radiculopathy, cervical region: Secondary | ICD-10-CM | POA: Insufficient documentation

## 2019-05-30 DIAGNOSIS — M75121 Complete rotator cuff tear or rupture of right shoulder, not specified as traumatic: Secondary | ICD-10-CM | POA: Insufficient documentation

## 2019-05-30 DIAGNOSIS — M7582 Other shoulder lesions, left shoulder: Secondary | ICD-10-CM | POA: Insufficient documentation

## 2019-08-22 ENCOUNTER — Other Ambulatory Visit: Payer: Self-pay | Admitting: Internal Medicine

## 2019-08-22 DIAGNOSIS — Z1231 Encounter for screening mammogram for malignant neoplasm of breast: Secondary | ICD-10-CM

## 2019-10-07 DIAGNOSIS — I10 Essential (primary) hypertension: Secondary | ICD-10-CM | POA: Insufficient documentation

## 2019-10-07 DIAGNOSIS — N182 Chronic kidney disease, stage 2 (mild): Secondary | ICD-10-CM | POA: Insufficient documentation

## 2019-10-15 ENCOUNTER — Ambulatory Visit
Admission: RE | Admit: 2019-10-15 | Discharge: 2019-10-15 | Disposition: A | Discharge status: Home / Self Care | Payer: Medicare Other | Source: Ambulatory Visit | Attending: Internal Medicine | Admitting: Internal Medicine

## 2019-10-15 DIAGNOSIS — Z1231 Encounter for screening mammogram for malignant neoplasm of breast: Secondary | ICD-10-CM | POA: Insufficient documentation

## 2019-11-18 NOTE — Progress Notes (Signed)
Gynecology Annual Exam  PCP: Idelle Crouch, MD  Chief Complaint:  Chief Complaint  Patient presents with  . Gynecologic Exam    History of Present Illness:Cheryl Kirk is a 68 year old Caucasian/White female, G4 P4005, who presents for her annual exam. She is having no significant GYN problems. Her menses are absent and she is postmenopausal. She does not have hot flashes and night sweats.  She has had no spotting.   The patient's past medical history is notable for a history of diabetes, CKD, chronic microscopic hematuria, hypertension, hyperlipidemia, shoulder pain, and DJD/spinal stenosis..  Since her last annual GYN exam dated 09/27/17 , she  Has had no significant changes in her health.  Her renal function has remained stable ( GFR 49-55). Her last hmg A1C was 6.1%. She is sexually active. She does not have vaginal dryness.  Her most recent pap smear was obtained 09/27/2017 and was NIL.  Her most recent mammogram obtained 10/15/2019 was Birads 1. There is no family history of breast cancer.  There is no family history of ovarian cancer.  The patient does do occ self breast exams.  She had a colonoscopy in 12/25/2014 that showed diverticulosis. Her next colonoscopy is due in 2021 She had a recent DEXA scan obtained 11/19/2018. Results: femoral neck T score -1.7 and total hip was normal T score. Frax scores were 1.4% and 9.8% The patient does not smoke.  The patient does not drink alcohol on her current diet.  The patient does not use illegal drugs.  The patient exercises regularly, walking (and swimming in the summer) The patient does get adequate calcium in her diet and with her supplement . She currently is on a plant based diet + fish and eggs and yogart She had a recent cholesterol screen in 2020 by PCP Dr Doy Hutching that was normal.      Review of Systems: Review of Systems  Constitutional: Negative for chills, fever and weight loss.  HENT: Negative for  congestion, sinus pain and sore throat.   Eyes: Negative for blurred vision and pain.  Respiratory: Negative for hemoptysis, shortness of breath and wheezing.   Cardiovascular: Negative for chest pain, palpitations and leg swelling.  Gastrointestinal: Negative for abdominal pain, blood in stool, diarrhea, heartburn, nausea and vomiting.  Genitourinary: Negative for dysuria, frequency, hematuria and urgency.  Musculoskeletal: Positive for joint pain (right shoulder pain). Negative for back pain and myalgias.  Skin: Negative for itching and rash.  Neurological: Negative for dizziness, tingling and headaches.  Endo/Heme/Allergies: Negative for environmental allergies and polydipsia. Bruises/bleeds easily (on baby ASA daily).       Negative for hirsutism   Psychiatric/Behavioral: Negative for depression. The patient is not nervous/anxious and does not have insomnia.     Past Medical History:  Past Medical History:  Diagnosis Date  . Chronic kidney disease 2012   stage 3  Dr, Jabier Mutton  . Diabetes mellitus without complication ([place])   . Fibroids   . Heart murmur    needs no follow up  . History of degenerative disc disease    spinal stenosis  . History of kidney stones    many years ago  . Hyperlipidemia   . Hypertension    BP and occular  . Kidney stones   . Menorrhagia   . Microscopic hematuria   . Obesity   . Ocular hypertension     Past Surgical History:  Past Surgical History:  Procedure Laterality Date  .  CARDIAC CATHETERIZATION  2009  . CATARACT EXTRACTION Right October 2013  . COLONOSCOPY  2016   normal  . CYSTOSCOPY WITH BIOPSY N/A 01/02/2017   Procedure: CYSTOSCOPY WITH BIOPSY;  Surgeon: Hollice Espy, MD;  Location: [facility] ORS;  Service: Urology;  Laterality: N/A;  . DILATION AND CURETTAGE OF UTERUS  ~2000   bleeding issues; MAD  . ENDOMETRIAL BIOPSY  1996/ 2000  . HYSTEROSCOPY W/D&C  01/14/1998   cystic hyperplasia  . TONSILLECTOMY    . URETEROSCOPY WITH HOLMIUM  LASER LITHOTRIPSY      Family History:  Family History  Problem Relation Age of Onset  . Diabetes Mother   . Hypertension Mother   . Diabetes Father   . Heart disease Father        coronary artery disease  . Hyperlipidemia Father   . Diabetes Sister   . Hypertension Sister   . Hyperlipidemia Sister   . Non-Hodgkin's lymphoma Sister 71  . Diabetes Brother   . Hypertension Brother   . Hyperlipidemia Brother   . Hypertension Sister   . Hyperlipidemia Sister   . Hyperlipidemia Sister   . Melanoma Daughter 37  . Cancer Daughter        rare form of sarcoma on her leg  . Bladder Cancer Neg Hx   . Prostate cancer Neg Hx   . Kidney cancer Neg Hx   . Breast cancer Neg Hx     Social History:  Social History   Socioeconomic History  . Marital status: Married    Spouse name: Not on file  . Number of children: 5  . Years of education: 81  . Highest education level: Not on file  Occupational History  . Occupation: Therapist, sports  Tobacco Use  . Smoking status: Former Smoker    Years: 1.50    Quit date: 04/19/1972    Years since quitting: 47.6  . Smokeless tobacco: Never Used  Substance and Sexual Activity  . Alcohol use: Yes    Alcohol/week: 0.0 standard drinks    Comment: rare  . Drug use: No  . Sexual activity: Yes    Partners: Male    Birth control/protection: Post-menopausal  Other Topics Concern  . Not on file  Social History Narrative   Has 12 grandchildren (2019)   Social Determinants of Health   Financial Resource Strain:   . Difficulty of Paying Living Expenses: Not on file  Food Insecurity:   . Worried About Charity fundraiser in the Last Year: Not on file  . Ran Out of Food in the Last Year: Not on file  Transportation Needs:   . Lack of Transportation (Medical): Not on file  . Lack of Transportation (Non-Medical): Not on file  Physical Activity:   . Days of Exercise per Week: Not on file  . Minutes of Exercise per Session: Not on file  Stress:   . Feeling  of Stress : Not on file  Social Connections:   . Frequency of Communication with Friends and Family: Not on file  . Frequency of Social Gatherings with Friends and Family: Not on file  . Attends Religious Services: Not on file  . Active Member of Clubs or Organizations: Not on file  . Attends Archivist Meetings: Not on file  . Marital Status: Not on file  Intimate Partner Violence:   . Fear of Current or Ex-Partner: Not on file  . Emotionally Abused: Not on file  . Physically Abused: Not on file  .  Sexually Abused: Not on file    Allergies:  No Known Allergies  Medications: Current Outpatient Medications:  .  Ascorbic Acid (VITAMIN C) 1000 MG tablet, Take 1,000 mg by mouth 3 (three) times a week. , Disp: , Rfl:  .  Calcium 500-125 MG-UNIT TABS, Take 1 tablet by mouth daily., Disp: , Rfl:  .  Cholecalciferol (VITAMIN D3) 2000 units TABS, Take 1 tablet by mouth 3 (three) times a week., Disp: , Rfl:  .  Cyanocobalamin (VITAMIN B-12) 5000 MCG TBDP, Take 5,000 mcg by mouth 3 (three) times a week., Disp: , Rfl:  .  furosemide (LASIX) 20 MG tablet, Take 20 mg by mouth daily as needed for fluid., Disp: , Rfl:  .  glipiZIDE (GLUCOTROL) 10 MG tablet, Take 10 mg by mouth daily before breakfast., Disp: , Rfl:  .  glucose blood (CONTOUR NEXT TEST) test strip, USE ONCE DAILY AS DIRECTED, Disp: , Rfl:  .  Hypromellose (NATURAL BALANCE TEARS OP), Place 1-2 drops into both eyes 3 (three) times daily as needed (for dry/irritated eyes)., Disp: , Rfl:  .  KRILL OIL PO, Take 1 capsule by mouth See admin instructions. 2-3x's a week, Disp: , Rfl:  .  Lancets (ACCU-CHEK MULTICLIX) lancets, USE AS DIRECTED, Disp: , Rfl:  .  latanoprost (XALATAN) 0.005 % ophthalmic solution, Place 1 drop into both eyes at bedtime., Disp: , Rfl:  .  Loratadine (CLARITIN) 10 MG CAPS, Take 1 tablet by mouth daily., Disp: , Rfl:  .  MAGNESIUM PO, Take 150 mg by mouth 3 (three) times a week., Disp: , Rfl:  .  metFORMIN  (GLUCOPHAGE) 1000 MG tablet, Take 1,000 mg by mouth daily with breakfast., Disp: , Rfl:  .  methocarbamol (ROBAXIN) 750 MG tablet, Take 1 tablet by mouth 2 (two) times daily., Disp: , Rfl:  .  metoprolol succinate (TOPROL-XL) 50 MG 24 hr tablet, Take 1 tablet by mouth daily., Disp: , Rfl:  .  olmesartan (BENICAR) 40 MG tablet, Take 40 mg by mouth daily., Disp: , Rfl:  .  Omega-3 Fatty Acids (FISH OIL) 1000 MG CAPS, Take 1 tablet by mouth daily., Disp: , Rfl:  .  pravastatin (PRAVACHOL) 20 MG tablet, Take by mouth., Disp: , Rfl:  .  terconazole (TERAZOL 7) 0.4 % vaginal cream, INSERT 1 APPLICATORFUL VAGINALLY EVERY NIGHT AT BEDTIME FOR 7 NIGHTS, Disp: 45 g, Rfl: 2 .  triamcinolone cream (KENALOG) 0.1 %, Apply 1 application topically as needed., Disp: , Rfl:  Physical Exam Vitals: BP 110/60   Pulse 66   Temp (!) 97 F (36.1 C)   Ht 5' 1.5" (1.562 m)   Wt 153 lb (69.4 kg)   BMI 28.44 kg/m  General: pleasant WF in  NAD HEENT: normocephalic, anicteric Neck: no thyroid enlargement, no palpable nodules, no cervical lymphadenopathy  Pulmonary: No increased work of breathing, CTAB Cardiovascular: RRR, without murmur  Breast: Breast symmetrical, no tenderness, no palpable nodules or masses, no skin or nipple retraction present, no nipple discharge.  No axillary, infraclavicular or supraclavicular lymphadenopathy. Abdomen: Soft, non-tender, non-distended.  Umbilicus without lesions.  No hepatomegaly or masses palpable. No evidence of hernia. Genitourinary:  External: Normal external female genitalia.  Normal urethral meatus, normal Bartholin's and Skene's glands.    Vagina: white scar tissue at introitus, no evidence of prolapse, scant clear discharge   Cervix: Grossly normal in appearance, no bleeding, non-tender  Uterus: Midplane, normal size, shape, and consistency, mobile, and non-tender  Adnexa: No adnexal masses, non-tender  Rectal: deferred  Lymphatic: no evidence of inguinal  lymphadenopathy Extremities: no edema, erythema, or tenderness Neurologic: Grossly intact Psychiatric: mood appropriate, affect full     Assessment: 67 y.o. postmenopausal female for annual exam Osteopenia of femoral neck  Plan:   1) Breast cancer screening - recommend monthly self breast exam and annual mammograms. Mammogram is up to date.  2) Colon cancer screening-colonoscopy UTD, next due in 2021  3) Cervical cancer screening - Pap was done. ASCCP guidelines and rational discussed.  Patient opts for every other year screening interval  4) Routine healthcare maintenance including cholesterol  screening managed by PCP   5) Osteopenia: continue calcium and vitamin D3 supplements as well as exercise.  Marland Kitchen  5) RTO 1 year and prn.  Dalia Heading, CNM

## 2019-11-19 ENCOUNTER — Encounter: Payer: Self-pay | Admitting: Certified Nurse Midwife

## 2019-11-19 ENCOUNTER — Other Ambulatory Visit: Payer: Self-pay

## 2019-11-19 ENCOUNTER — Other Ambulatory Visit (HOSPITAL_COMMUNITY)
Admission: RE | Admit: 2019-11-19 | Discharge: 2019-11-19 | Disposition: A | Discharge status: Home / Self Care | Payer: Medicare Other | Source: Ambulatory Visit | Attending: Certified Nurse Midwife | Admitting: Certified Nurse Midwife

## 2019-11-19 ENCOUNTER — Ambulatory Visit (INDEPENDENT_AMBULATORY_CARE_PROVIDER_SITE_OTHER): Payer: Medicare Other | Admitting: Certified Nurse Midwife

## 2019-11-19 VITALS — BP 110/60 | HR 66 | Temp 97.0°F | Ht 61.5 in | Wt 153.0 lb

## 2019-11-19 DIAGNOSIS — Z124 Encounter for screening for malignant neoplasm of cervix: Secondary | ICD-10-CM | POA: Insufficient documentation

## 2019-11-19 DIAGNOSIS — Z01419 Encounter for gynecological examination (general) (routine) without abnormal findings: Secondary | ICD-10-CM

## 2019-11-19 DIAGNOSIS — M8580 Other specified disorders of bone density and structure, unspecified site: Secondary | ICD-10-CM

## 2019-11-23 ENCOUNTER — Encounter: Payer: Self-pay | Admitting: Certified Nurse Midwife

## 2019-11-23 DIAGNOSIS — Z780 Asymptomatic menopausal state: Secondary | ICD-10-CM | POA: Insufficient documentation

## 2019-11-24 LAB — CYTOLOGY - PAP: Diagnosis: NEGATIVE

## 2020-01-01 ENCOUNTER — Telehealth: Payer: Self-pay

## 2020-01-01 NOTE — Telephone Encounter (Signed)
Contacted patient in regards to scheduling her colonoscopy for the month of Feb/March.  She would like to wait to see how things continue to go with COVID.  Requested a call back in March to discuss scheduling again at that time.  Reminder created to call her back in March.  Thanks,  [place], [place]

## 2020-03-02 ENCOUNTER — Other Ambulatory Visit: Payer: Self-pay

## 2020-03-10 ENCOUNTER — Telehealth: Payer: Self-pay

## 2020-03-10 NOTE — Telephone Encounter (Signed)
Patient LVM with me to schedule her colonoscopy.  I've reopened her referral.  Please call to schedule her a nurse televisit or nurse office visit to discuss and schedule her colonoscopy.  Thanks,  [place], [place]

## 2020-03-15 ENCOUNTER — Ambulatory Visit (INDEPENDENT_AMBULATORY_CARE_PROVIDER_SITE_OTHER): Payer: Self-pay | Admitting: Gastroenterology

## 2020-03-15 DIAGNOSIS — Z1211 Encounter for screening for malignant neoplasm of colon: Secondary | ICD-10-CM

## 2020-03-15 MED ORDER — NA SULFATE-K SULFATE-MG SULF 17.5-3.13-1.6 GM/177ML PO SOLN: 1.0000 | Freq: Once | ORAL | 0 refills | Status: AC

## 2020-03-15 NOTE — Progress Notes (Signed)
Gastroenterology Pre-Procedure Review  Request Date: may 13th Requesting Physician: Dr. Vicente Males  PATIENT REVIEW QUESTIONS: The patient responded to the following health history questions as indicated:    1. Are you having any GI issues? yes (occasional gas and bloating.  taking probiotic) 2. Do you have a personal history of Polyps? no 3. Do you have a family history of Colon Cancer or Polyps? yes (siblings may have had polyps) 4. Diabetes Mellitus? no 5. Joint replacements in the past 12 months?no 6. Major health problems in the past 3 months?no 7. Any artificial heart valves, MVP, or defibrillator?no    MEDICATIONS & ALLERGIES:    Patient reports the following regarding taking any anticoagulation/antiplatelet therapy:   Plavix, Coumadin, Eliquis, Xarelto, Lovenox, Pradaxa, Brilinta, or Effient? no Aspirin? no  Patient confirms/reports the following medications:  Current Outpatient Medications  Medication Sig Dispense Refill  . Ascorbic Acid (VITAMIN C) 1000 MG tablet Take 1,000 mg by mouth 3 (three) times a week.     . Calcium 500-125 MG-UNIT TABS Take 1 tablet by mouth daily.    . Cholecalciferol (VITAMIN D3) 2000 units TABS Take 1 tablet by mouth 3 (three) times a week.    . Cyanocobalamin (VITAMIN B-12) 5000 MCG TBDP Take 5,000 mcg by mouth 3 (three) times a week.    . fluticasone (FLONASE) 50 MCG/ACT nasal spray Place 2 sprays into both nostrils daily.    . furosemide (LASIX) 20 MG tablet Take 20 mg by mouth daily as needed for fluid.    Marland Kitchen gentamicin ointment (GARAMYCIN) 0.1 %     . glipiZIDE (GLUCOTROL XL) 5 MG 24 hr tablet Take 5 mg by mouth daily.    Marland Kitchen glipiZIDE (GLUCOTROL) 10 MG tablet Take 10 mg by mouth daily before breakfast.    . glucose blood (CONTOUR NEXT TEST) test strip USE ONCE DAILY AS DIRECTED    . Hypromellose (NATURAL BALANCE TEARS OP) Place 1-2 drops into both eyes 3 (three) times daily as needed (for dry/irritated eyes).    Marland Kitchen KRILL OIL PO Take 1 capsule by  mouth See admin instructions. 2-3x's a week    . Lancets (ACCU-CHEK MULTICLIX) lancets USE AS DIRECTED    . latanoprost (XALATAN) 0.005 % ophthalmic solution Place 1 drop into both eyes at bedtime.    . Loratadine (CLARITIN) 10 MG CAPS Take 1 tablet by mouth daily.    Marland Kitchen MAGNESIUM PO Take 150 mg by mouth 3 (three) times a week.    . metFORMIN (GLUCOPHAGE-XR) 500 MG 24 hr tablet TAKE 2 TABLETS BY MOUTH EVERY DAY    . metFORMIN (GLUCOPHAGE-XR) 500 MG 24 hr tablet Take 1,000 mg by mouth daily.    . methocarbamol (ROBAXIN) 750 MG tablet Take 1 tablet by mouth 2 (two) times daily.    . metoprolol succinate (TOPROL-XL) 50 MG 24 hr tablet Take by mouth.    . Na Sulfate-K Sulfate-Mg Sulf 17.5-3.13-1.6 GM/177ML SOLN Take 1 kit by mouth once for 1 dose. 354 mL 0  . olmesartan (BENICAR) 40 MG tablet Take by mouth.    . Omega-3 Fatty Acids (FISH OIL) 1000 MG CAPS Take 1 tablet by mouth daily.    . pravastatin (PRAVACHOL) 20 MG tablet Take by mouth.    . terconazole (TERAZOL 7) 0.4 % vaginal cream INSERT 1 APPLICATORFUL VAGINALLY EVERY NIGHT AT BEDTIME FOR 7 NIGHTS 45 g 2  . tobramycin (TOBREX) 0.3 % ophthalmic solution 1 drop every 4 (four) hours.    . triamcinolone cream (KENALOG)  0.1 % Apply 1 application topically as needed.    . valACYclovir (VALTREX) 1000 MG tablet      No current facility-administered medications for this visit.    Patient confirms/reports the following allergies:  No Known Allergies  No orders of the defined types were placed in this encounter.   AUTHORIZATION INFORMATION Primary Insurance: 1D#: Group #:  Secondary Insurance: 1D#: Group #:  SCHEDULE INFORMATION: Date: May 13th Time: Location:[facility]

## 2020-04-20 ENCOUNTER — Other Ambulatory Visit
Admission: RE | Admit: 2020-04-20 | Discharge: 2020-04-20 | Disposition: A | Discharge status: Home / Self Care | Payer: Medicare Other | Source: Ambulatory Visit | Attending: Gastroenterology | Admitting: Gastroenterology

## 2020-04-20 DIAGNOSIS — Z01812 Encounter for preprocedural laboratory examination: Secondary | ICD-10-CM | POA: Insufficient documentation

## 2020-04-20 DIAGNOSIS — Z20822 Contact with and (suspected) exposure to covid-19: Secondary | ICD-10-CM | POA: Diagnosis not present

## 2020-04-20 LAB — SARS CORONAVIRUS 2 (TAT 6-24 HRS): SARS Coronavirus 2: NEGATIVE

## 2020-04-22 ENCOUNTER — Ambulatory Visit: Payer: Medicare Other | Admitting: Certified Registered"

## 2020-04-22 ENCOUNTER — Ambulatory Visit
Admission: RE | Admit: 2020-04-22 | Discharge: 2020-04-22 | Disposition: A | Discharge status: Home / Self Care | Payer: Medicare Other | Attending: Gastroenterology | Admitting: Gastroenterology

## 2020-04-22 ENCOUNTER — Other Ambulatory Visit: Payer: Self-pay

## 2020-04-22 ENCOUNTER — Encounter: Payer: Self-pay | Admitting: Gastroenterology

## 2020-04-22 ENCOUNTER — Encounter
Admission: RE | Disposition: A | Discharge status: Home / Self Care | Payer: Self-pay | Source: Home / Self Care | Attending: Gastroenterology

## 2020-04-22 DIAGNOSIS — E1122 Type 2 diabetes mellitus with diabetic chronic kidney disease: Secondary | ICD-10-CM | POA: Insufficient documentation

## 2020-04-22 DIAGNOSIS — Z7984 Long term (current) use of oral hypoglycemic drugs: Secondary | ICD-10-CM | POA: Insufficient documentation

## 2020-04-22 DIAGNOSIS — Z86010 Personal history of colonic polyps: Secondary | ICD-10-CM | POA: Insufficient documentation

## 2020-04-22 DIAGNOSIS — Z87891 Personal history of nicotine dependence: Secondary | ICD-10-CM | POA: Insufficient documentation

## 2020-04-22 DIAGNOSIS — Z79899 Other long term (current) drug therapy: Secondary | ICD-10-CM | POA: Insufficient documentation

## 2020-04-22 DIAGNOSIS — N189 Chronic kidney disease, unspecified: Secondary | ICD-10-CM | POA: Diagnosis not present

## 2020-04-22 DIAGNOSIS — Z6830 Body mass index (BMI) 30.0-30.9, adult: Secondary | ICD-10-CM | POA: Insufficient documentation

## 2020-04-22 DIAGNOSIS — I129 Hypertensive chronic kidney disease with stage 1 through stage 4 chronic kidney disease, or unspecified chronic kidney disease: Secondary | ICD-10-CM | POA: Insufficient documentation

## 2020-04-22 DIAGNOSIS — E785 Hyperlipidemia, unspecified: Secondary | ICD-10-CM | POA: Diagnosis not present

## 2020-04-22 DIAGNOSIS — D123 Benign neoplasm of transverse colon: Secondary | ICD-10-CM | POA: Diagnosis not present

## 2020-04-22 DIAGNOSIS — Z833 Family history of diabetes mellitus: Secondary | ICD-10-CM | POA: Insufficient documentation

## 2020-04-22 DIAGNOSIS — Z8249 Family history of ischemic heart disease and other diseases of the circulatory system: Secondary | ICD-10-CM | POA: Insufficient documentation

## 2020-04-22 DIAGNOSIS — Z1211 Encounter for screening for malignant neoplasm of colon: Secondary | ICD-10-CM | POA: Diagnosis not present

## 2020-04-22 DIAGNOSIS — K635 Polyp of colon: Secondary | ICD-10-CM | POA: Diagnosis not present

## 2020-04-22 DIAGNOSIS — E669 Obesity, unspecified: Secondary | ICD-10-CM | POA: Diagnosis not present

## 2020-04-22 HISTORY — PX: COLONOSCOPY WITH PROPOFOL: SHX5780

## 2020-04-22 LAB — GLUCOSE, CAPILLARY: Glucose-Capillary: 137 mg/dL — ABNORMAL HIGH (ref 70–99)

## 2020-04-22 SURGERY — COLONOSCOPY WITH PROPOFOL
Anesthesia: General

## 2020-04-22 MED ORDER — PROPOFOL 500 MG/50ML IV EMUL
INTRAVENOUS | Status: AC
  Filled 2020-04-22: qty 50

## 2020-04-22 MED ORDER — SODIUM CHLORIDE 0.9 % IV SOLN: INTRAVENOUS | Status: DC

## 2020-04-22 MED ORDER — PROPOFOL 500 MG/50ML IV EMUL
INTRAVENOUS | Status: DC | PRN
  Administered 2020-04-22: 125 ug/kg/min via INTRAVENOUS
  Administered 2020-04-22: 50 mg via INTRAVENOUS

## 2020-04-22 NOTE — Anesthesia Preprocedure Evaluation (Addendum)
Anesthesia Evaluation  Patient identified by MRN, date of birth, ID band Patient awake    Reviewed: Allergy & Precautions, H&P , NPO status , reviewed documented beta blocker date and time   Airway Mallampati: II  TM Distance: >3 FB Neck ROM: full    Dental  (+) Teeth Intact   Pulmonary former smoker,    Pulmonary exam normal        Cardiovascular hypertension, Normal cardiovascular exam     Neuro/Psych    GI/Hepatic neg GERD  ,  Endo/Other  diabetes  Renal/GU Renal disease     Musculoskeletal  (+) Arthritis ,   Abdominal   Peds  Hematology  (+) Blood dyscrasia, anemia ,   Anesthesia Other Findings Past Medical History: 2012: Chronic kidney disease     Comment:  stage 3  Dr, Jabier Mutton No date: Diabetes mellitus without complication ([place]) No date: Fibroids No date: Heart murmur     Comment:  needs no follow up No date: History of degenerative disc disease     Comment:  spinal stenosis No date: History of kidney stones     Comment:  many years ago No date: Hyperlipidemia No date: Hypertension     Comment:  BP and occular No date: Kidney stones No date: Menorrhagia No date: Microscopic hematuria No date: Obesity No date: Ocular hypertension  Past Surgical History: 2009: CARDIAC CATHETERIZATION October 2013: CATARACT EXTRACTION; Right 2016: COLONOSCOPY     Comment:  normal 01/02/2017: CYSTOSCOPY WITH BIOPSY; N/A     Comment:  Procedure: CYSTOSCOPY WITH BIOPSY;  Surgeon: Hollice Espy, MD;  Location: [facility] ORS;  Service: Urology;                Laterality: N/A; ~2000: DILATION AND CURETTAGE OF UTERUS     Comment:  bleeding issues; MAD 1996/ 2000: ENDOMETRIAL BIOPSY 01/14/1998: HYSTEROSCOPY WITH D & C     Comment:  cystic hyperplasia No date: TONSILLECTOMY No date: URETEROSCOPY WITH HOLMIUM LASER LITHOTRIPSY  BMI    Body Mass Index: 30.00 kg/m      Reproductive/Obstetrics                             Anesthesia Physical Anesthesia Plan  ASA: III  Anesthesia Plan: General   Post-op Pain Management:    Induction: Intravenous  PONV Risk Score and Plan: Treatment may vary due to age or medical condition and TIVA  Airway Management Planned: Nasal Cannula and Natural Airway  Additional Equipment:   Intra-op Plan:   Post-operative Plan:   Informed Consent: I have reviewed the patients History and Physical, chart, labs and discussed the procedure including the risks, benefits and alternatives for the proposed anesthesia with the patient or authorized representative who has indicated his/her understanding and acceptance.     Dental Advisory Given  Plan Discussed with: CRNA  Anesthesia Plan Comments:         Anesthesia Quick Evaluation

## 2020-04-22 NOTE — Op Note (Addendum)
[facility] Gastroenterology Patient Name: Cheryl Kirk Procedure Date: 04/22/2020 10:07 AM MRN: [id number] Account #: [id number] Date of Birth: Apr 24, 1951 Admit Type: Outpatient Age: 69 Room: [facility] ENDO ROOM 3 Gender: Female Note Status: Finalized Procedure:             Colonoscopy Indications:           High risk colon cancer surveillance: Personal history                         of colonic polyps Providers:             Jonathon Bellows MD, MD Referring MD:          Leonie Douglas. Doy Hutching, MD (Referring MD) Medicines:             Monitored Anesthesia Care Complications:         No immediate complications. Procedure:             Pre-Anesthesia Assessment:                        - Prior to the procedure, a History and Physical was                         performed, and patient medications, allergies and                         sensitivities were reviewed. The patient's tolerance                         of previous anesthesia was reviewed.                        - The risks and benefits of the procedure and the                         sedation options and risks were discussed with the                         patient. All questions were answered and informed                         consent was obtained.                        - ASA Grade Assessment: II - A patient with mild                         systemic disease.                        After obtaining informed consent, the colonoscope was                         passed under direct vision. Throughout the procedure,                         the patient's blood pressure, pulse, and oxygen                         saturations were monitored continuously. The  Colonoscope was introduced through the anus and                         advanced to the the cecum, identified by the                         appendiceal orifice. The colonoscopy was performed                         with ease. The patient tolerated  the procedure well.                         The quality of the bowel preparation was excellent. Findings:      The perianal and digital rectal examinations were normal.      A 5 mm polyp was found in the transverse colon. The polyp was sessile.       The polyp was removed with a cold snare. Resection and retrieval were       complete.      The exam was otherwise without abnormality on direct and retroflexion       views. Impression:            - One 5 mm polyp in the transverse colon, removed with                         a cold snare. Resected and retrieved.                        - The examination was otherwise normal on direct and                         retroflexion views. Recommendation:        - Discharge patient to home (with escort).                        - Resume previous diet.                        - Continue present medications.                        - Await pathology results.                        - Repeat colonoscopy for surveillance based on                         pathology results. Procedure Code(s):     --- Professional ---                        [phone], Colonoscopy, flexible; with removal of                         tumor(s), polyp(s), or other lesion(s) by snare                         technique Diagnosis Code(s):     --- Professional ---  Z86.010, Personal history of colonic polyps                        K63.5, Polyp of colon CPT copyright 2019 American Medical Association. All rights reserved. The codes documented in this report are preliminary and upon coder review may  be revised to meet current compliance requirements. Jonathon Bellows, MD Jonathon Bellows MD, MD 04/22/2020 10:38:54 AM This report has been signed electronically. Number of Addenda: 0 Note Initiated On: 04/22/2020 10:07 AM Scope Withdrawal Time: 0 hours 11 minutes 7 seconds  Total Procedure Duration: 0 hours 16 minutes 0 seconds  Estimated Blood Loss:  Estimated blood loss: none.       [facility]

## 2020-04-22 NOTE — H&P (Signed)
Jonathon Bellows, MD [address], [place], [place], [place], [address] [address], [place], [place], [place], [address] Phone: [phone]  Fax: [phone]  Primary Care Physician:  Idelle Crouch, MD   Pre-Procedure History & Physical: HPI:  Cheryl Kirk is a 69 y.o. female is here for an colonoscopy.   Past Medical History:  Diagnosis Date  . Chronic kidney disease 2012   stage 3  Dr, Jabier Mutton  . Diabetes mellitus without complication ([place])   . Fibroids   . Heart murmur    needs no follow up  . History of degenerative disc disease    spinal stenosis  . History of kidney stones    many years ago  . Hyperlipidemia   . Hypertension    BP and occular  . Kidney stones   . Menorrhagia   . Microscopic hematuria   . Obesity   . Ocular hypertension     Past Surgical History:  Procedure Laterality Date  . CARDIAC CATHETERIZATION  2009  . CATARACT EXTRACTION Right October 2013  . COLONOSCOPY  2016   normal  . CYSTOSCOPY WITH BIOPSY N/A 01/02/2017   Procedure: CYSTOSCOPY WITH BIOPSY;  Surgeon: Hollice Espy, MD;  Location: [facility] ORS;  Service: Urology;  Laterality: N/A;  . DILATION AND CURETTAGE OF UTERUS  ~2000   bleeding issues; MAD  . ENDOMETRIAL BIOPSY  1996/ 2000  . HYSTEROSCOPY WITH D & C  01/14/1998   cystic hyperplasia  . TONSILLECTOMY    . URETEROSCOPY WITH HOLMIUM LASER LITHOTRIPSY      Prior to Admission medications   Medication Sig Start Date End Date Taking? Authorizing Provider  Ascorbic Acid (VITAMIN C) 1000 MG tablet Take 1,000 mg by mouth 3 (three) times a week.    Yes [provider]  Calcium 500-125 MG-UNIT TABS Take 1 tablet by mouth daily.   Yes [provider]  Cholecalciferol (VITAMIN D3) 2000 units TABS Take 1 tablet by mouth 3 (three) times a week.   Yes [provider]  Cyanocobalamin (VITAMIN B-12) 5000 MCG TBDP Take 5,000 mcg by mouth 3 (three) times a week.   Yes [provider]  furosemide (LASIX)  20 MG tablet Take 20 mg by mouth daily as needed for fluid.   Yes [provider]  glipiZIDE (GLUCOTROL) 10 MG tablet Take 10 mg by mouth daily before breakfast.   Yes [provider]  KRILL OIL PO Take 1 capsule by mouth See admin instructions. 2-3x's a week   Yes [provider]  latanoprost (XALATAN) 0.005 % ophthalmic solution Place 1 drop into both eyes at bedtime.   Yes [provider]  Loratadine (CLARITIN) 10 MG CAPS Take 1 tablet by mouth daily.   Yes [provider]  MAGNESIUM PO Take 150 mg by mouth 3 (three) times a week.   Yes [provider]  metFORMIN (GLUCOPHAGE-XR) 500 MG 24 hr tablet TAKE 2 TABLETS BY MOUTH EVERY DAY 01/30/20  Yes [provider]  methocarbamol (ROBAXIN) 750 MG tablet Take 1 tablet by mouth 2 (two) times daily. 11/09/19  Yes [provider]  metoprolol succinate (TOPROL-XL) 50 MG 24 hr tablet Take by mouth. 02/01/20  Yes [provider]  olmesartan (BENICAR) 40 MG tablet Take by mouth. 02/05/20  Yes [provider]  pravastatin (PRAVACHOL) 20 MG tablet Take by mouth.   Yes [provider]  fluticasone (FLONASE) 50 MCG/ACT nasal spray Place 2 sprays into both nostrils daily. 01/08/20  [provider]  gentamicin ointment (GARAMYCIN) 0.1 %  01/08/20   [provider]  glipiZIDE (GLUCOTROL XL) 5 MG 24 hr tablet Take 5 mg by mouth daily. 02/02/20   [provider]  glucose blood (CONTOUR NEXT TEST) test strip USE ONCE DAILY AS DIRECTED 09/17/17   [provider]  Hypromellose (NATURAL BALANCE TEARS OP) Place 1-2 drops into both eyes 3 (three) times daily as needed (for dry/irritated eyes).    [provider]  Lancets (ACCU-CHEK MULTICLIX) lancets USE AS DIRECTED 03/02/15   [provider]  metFORMIN (GLUCOPHAGE-XR) 500 MG 24 hr tablet Take 1,000 mg by mouth daily. 01/30/20   [provider]  Omega-3 Fatty Acids (FISH  OIL) 1000 MG CAPS Take 1 tablet by mouth daily.    [provider]  terconazole (TERAZOL 7) 0.4 % vaginal cream INSERT 1 APPLICATORFUL VAGINALLY EVERY NIGHT AT BEDTIME FOR 7 NIGHTS Patient not taking: Reported on 04/22/2020 11/12/18   Dalia Heading, CNM  tobramycin (TOBREX) 0.3 % ophthalmic solution 1 drop every 4 (four) hours. 12/08/19   [provider]  triamcinolone cream (KENALOG) 0.1 % Apply 1 application topically as needed. 08/12/19   [provider]  valACYclovir (VALTREX) 1000 MG tablet  01/08/20   [provider]    Allergies as of 03/15/2020  . (No Known Allergies)    Family History  Problem Relation Age of Onset  . Diabetes Mother   . Hypertension Mother   . Diabetes Father   . Heart disease Father        coronary artery disease  . Hyperlipidemia Father   . Diabetes Sister   . Hypertension Sister   . Hyperlipidemia Sister   . Non-Hodgkin's lymphoma Sister 47  . Diabetes Brother   . Hypertension Brother   . Hyperlipidemia Brother   . Hypertension Sister   . Hyperlipidemia Sister   . Hyperlipidemia Sister   . Melanoma Daughter 43  . Cancer Daughter        rare form of sarcoma on her leg  . Bladder Cancer Neg Hx   . Prostate cancer Neg Hx   . Kidney cancer Neg Hx   . Breast cancer Neg Hx     Social History   Socioeconomic History  . Marital status: Married    Spouse name: Not on file  . Number of children: 5  . Years of education: 7  . Highest education level: Not on file  Occupational History  . Occupation: Therapist, sports  Tobacco Use  . Smoking status: Former Smoker    Years: 1.50    Quit date: 04/19/1972    Years since quitting: 48.0  . Smokeless tobacco: Never Used  Substance and Sexual Activity  . Alcohol use: Yes    Alcohol/week: 0.0 standard drinks    Comment: rare  . Drug use: No  . Sexual activity: Yes    Partners: Male    Birth control/protection: Post-menopausal  Other Topics Concern  . Not on file  Social  History Narrative   Has 12 grandchildren (2019)   Social Determinants of Radio broadcast assistant Strain:   . Difficulty of Paying Living Expenses:   Food Insecurity:   . Worried About Charity fundraiser in the Last Year:   . Arboriculturist in the Last Year:   Transportation Needs:   . Film/video editor (Medical):   Marland Kitchen Lack of Transportation (Non-Medical):   Physical Activity:   . Days of Exercise  per Week:   . Minutes of Exercise per Session:   Stress:   . Feeling of Stress :   Social Connections:   . Frequency of Communication with Friends and Family:   . Frequency of Social Gatherings with Friends and Family:   . Attends Religious Services:   . Active Member of Clubs or Organizations:   . Attends Archivist Meetings:   Marland Kitchen Marital Status:   Intimate Partner Violence:   . Fear of Current or Ex-Partner:   . Emotionally Abused:   Marland Kitchen Physically Abused:   . Sexually Abused:     Review of Systems: See HPI, otherwise negative ROS  Physical Exam: BP (!) 128/54   Pulse 100   Temp 97.7 F (36.5 C)   Resp 18   Ht 5\' 2"  (1.575 m)   Wt 74.4 kg   SpO2 100%   BMI 30.00 kg/m  General:   Alert,  pleasant and cooperative in NAD Head:  Normocephalic and atraumatic. Neck:  Supple; no masses or thyromegaly. Lungs:  Clear throughout to auscultation, normal respiratory effort.    Heart:  +S1, +S2, Regular rate and rhythm, No edema. Abdomen:  Soft, nontender and nondistended. Normal bowel sounds, without guarding, and without rebound.   Neurologic:  Alert and  oriented x4;  grossly normal neurologically.  Impression/Plan: Cheryl Kirk is here for an colonoscopy to be performed for surveillance due to prior history of colon polyps   Risks, benefits, limitations, and alternatives regarding  colonoscopy have been reviewed with the patient.  Questions have been answered.  All parties agreeable.   Jonathon Bellows, MD  04/22/2020, 10:08 AM

## 2020-04-22 NOTE — Anesthesia Postprocedure Evaluation (Signed)
Anesthesia Post Note  Patient: Cheryl Kirk  Procedure(s) Performed: COLONOSCOPY WITH PROPOFOL (N/A )  Patient location during evaluation: Endoscopy Anesthesia Type: General Level of consciousness: awake and alert Pain management: pain level controlled Vital Signs Assessment: post-procedure vital signs reviewed and stable Respiratory status: spontaneous breathing, nonlabored ventilation and respiratory function stable Cardiovascular status: blood pressure returned to baseline and stable Postop Assessment: no apparent nausea or vomiting Anesthetic complications: no     Last Vitals:  Vitals:   04/22/20 1040 04/22/20 1100  BP: 102/61 120/62  Pulse: 86 78  Resp: 11 15  Temp: (!) 36.1 C   SpO2: 100% 100%    Last Pain:  Vitals:   04/22/20 1100  TempSrc:   PainSc: 0-No pain                 Alphonsus Sias

## 2020-04-22 NOTE — Transfer of Care (Signed)
Immediate Anesthesia Transfer of Care Note  Patient: Cheryl Kirk  Procedure(s) Performed: COLONOSCOPY WITH PROPOFOL (N/A )  Patient Location: Endoscopy Unit  Anesthesia Type:General  Level of Consciousness: awake, alert  and oriented  Airway & Oxygen Therapy: Patient Spontanous Breathing  Post-op Assessment: Report given to RN and Post -op Vital signs reviewed and stable  Post vital signs: Reviewed and stable  Last Vitals:  Vitals Value Taken Time  BP 102/61 04/22/20 1041  Temp 36.1 C 04/22/20 1040  Pulse 84 04/22/20 1042  Resp 20 04/22/20 1042  SpO2 100 % 04/22/20 1042  Vitals shown include unvalidated device data.  Last Pain:  Vitals:   04/22/20 1040  TempSrc: Temporal  PainSc:          Complications: No apparent anesthesia complications

## 2020-04-23 ENCOUNTER — Encounter: Payer: Self-pay | Admitting: *Deleted

## 2020-04-23 LAB — SURGICAL PATHOLOGY

## 2020-04-26 ENCOUNTER — Encounter: Payer: Self-pay | Admitting: Gastroenterology

## 2020-05-11 ENCOUNTER — Encounter: Payer: Self-pay | Admitting: *Deleted

## 2020-06-22 ENCOUNTER — Encounter (INDEPENDENT_AMBULATORY_CARE_PROVIDER_SITE_OTHER): Payer: Self-pay | Admitting: Vascular Surgery

## 2020-06-22 ENCOUNTER — Ambulatory Visit (INDEPENDENT_AMBULATORY_CARE_PROVIDER_SITE_OTHER): Payer: Medicare Other | Admitting: Nurse Practitioner

## 2020-06-22 ENCOUNTER — Other Ambulatory Visit: Payer: Self-pay

## 2020-06-22 VITALS — BP 114/71 | HR 74 | Ht 62.0 in | Wt 167.0 lb

## 2020-06-22 DIAGNOSIS — E7800 Pure hypercholesterolemia, unspecified: Secondary | ICD-10-CM

## 2020-06-22 DIAGNOSIS — I10 Essential (primary) hypertension: Secondary | ICD-10-CM | POA: Diagnosis not present

## 2020-06-22 DIAGNOSIS — I6523 Occlusion and stenosis of bilateral carotid arteries: Secondary | ICD-10-CM | POA: Diagnosis not present

## 2020-06-22 MED ORDER — PRAVASTATIN SODIUM 40 MG PO TABS: 40.0000 mg | ORAL_TABLET | Freq: Every day | ORAL | 3 refills | Status: DC

## 2020-06-22 NOTE — Progress Notes (Signed)
Subjective:    Patient ID: Cheryl Kirk, female    DOB: 1951/02/25, 69 y.o.   MRN: [id number] Chief Complaint  Patient presents with   New Patient (Initial Visit)    Left carotid Bruit    The patient is seen for evaluation of carotid stenosis. The carotid stenosis was identified after a left carotid bruit was heard by her primary care provider Dr. Doy Hutching.  The patient denies amaurosis fugax. There is no recent history of TIA symptoms or focal motor deficits. There is no prior documented CVA.  There is no history of migraine headaches. There is no history of seizures.  The patient is taking enteric-coated aspirin 81 mg daily.  The patient does not have a history of coronary artery disease, no recent episodes of angina or shortness of breath. The patient denies PAD or claudication symptoms. There is a history of hyperlipidemia which is being treated with a statin.   The patient underwent a carotid artery duplex at [facility].  The right internal carotid artery velocities are consistent with a 40 to 59% stenosis with the left internal carotid artery velocities consistent with a 1 to 39% stenosis.   Review of Systems  Neurological: Negative for dizziness, speech difficulty and light-headedness.  All other systems reviewed and are negative.      Objective:   Physical Exam Vitals reviewed.  HENT:     Head: Normocephalic.  Neck:     Vascular: Carotid bruit (Left) present.  Cardiovascular:     Rate and Rhythm: Normal rate and regular rhythm.     Pulses: Normal pulses.     Heart sounds: Normal heart sounds.  Pulmonary:     Effort: Pulmonary effort is normal.     Breath sounds: Normal breath sounds.  Neurological:     Mental Status: She is alert and oriented to person, place, and time.  Psychiatric:        Mood and Affect: Mood normal.        Behavior: Behavior normal.        Thought Content: Thought content normal.        Judgment: Judgment normal.     BP  114/71    Pulse 74    Ht 5\' 2"  (1.575 m)    Wt 167 lb (75.8 kg)    BMI 30.54 kg/m   Past Medical History:  Diagnosis Date   Chronic kidney disease 2012   stage 3  Dr, Jabier Mutton   Diabetes mellitus without complication ([place])    Fibroids    Heart murmur    needs no follow up   History of degenerative disc disease    spinal stenosis   History of kidney stones    many years ago   Hyperlipidemia    Hypertension    BP and occular   Kidney stones    Menorrhagia    Microscopic hematuria    Obesity    Ocular hypertension     Social History   Socioeconomic History   Marital status: Married    Spouse name: Not on file   Number of children: 5   Years of education: 16   Highest education level: Not on file  Occupational History   Occupation: RN  Tobacco Use   Smoking status: Former Smoker    Years: 1.50    Quit date: 04/19/1972    Years since quitting: 48.2   Smokeless tobacco: Never Used  Vaping Use   Vaping Use: Never used  Substance and  Sexual Activity   Alcohol use: Yes    Alcohol/week: 0.0 standard drinks    Comment: rare   Drug use: No   Sexual activity: Yes    Partners: Male    Birth control/protection: Post-menopausal  Other Topics Concern   Not on file  Social History Narrative   Has 12 grandchildren (2019)   Social Determinants of Radio broadcast assistant Strain:    Difficulty of Paying Living Expenses:   Food Insecurity:    Worried About Charity fundraiser in the Last Year:    Arboriculturist in the Last Year:   Transportation Needs:    Film/video editor (Medical):    Lack of Transportation (Non-Medical):   Physical Activity:    Days of Exercise per Week:    Minutes of Exercise per Session:   Stress:    Feeling of Stress :   Social Connections:    Frequency of Communication with Friends and Family:    Frequency of Social Gatherings with Friends and Family:    Attends Religious Services:    Active Member  of Clubs or Organizations:    Attends Archivist Meetings:    Marital Status:   Intimate Partner Violence:    Fear of Current or Ex-Partner:    Emotionally Abused:    Physically Abused:    Sexually Abused:     Past Surgical History:  Procedure Laterality Date   CARDIAC CATHETERIZATION  2009   CATARACT EXTRACTION Right October 2013   COLONOSCOPY  2016   normal   COLONOSCOPY WITH PROPOFOL N/A 04/22/2020   Procedure: COLONOSCOPY WITH PROPOFOL;  Surgeon: Jonathon Bellows, MD;  Location: [facility] ENDOSCOPY;  Service: Gastroenterology;  Laterality: N/A;   CYSTOSCOPY WITH BIOPSY N/A 01/02/2017   Procedure: CYSTOSCOPY WITH BIOPSY;  Surgeon: Hollice Espy, MD;  Location: [facility] ORS;  Service: Urology;  Laterality: N/A;   DILATION AND CURETTAGE OF UTERUS  ~2000   bleeding issues; MAD   ENDOMETRIAL BIOPSY  1996/ 2000   HYSTEROSCOPY WITH D & C  01/14/1998   cystic hyperplasia   TONSILLECTOMY     URETEROSCOPY WITH HOLMIUM LASER LITHOTRIPSY      Family History  Problem Relation Age of Onset   Diabetes Mother    Hypertension Mother    Diabetes Father    Heart disease Father        coronary artery disease   Hyperlipidemia Father    Diabetes Sister    Hypertension Sister    Hyperlipidemia Sister    Non-Hodgkin's lymphoma Sister 52   Diabetes Brother    Hypertension Brother    Hyperlipidemia Brother    Hypertension Sister    Hyperlipidemia Sister    Hyperlipidemia Sister    Melanoma Daughter 35   Cancer Daughter        rare form of sarcoma on her leg   Bladder Cancer Neg Hx    Prostate cancer Neg Hx    Kidney cancer Neg Hx    Breast cancer Neg Hx     No Known Allergies     Assessment & Plan:   1. Bilateral carotid artery stenosis Recommend:  Given the patient's asymptomatic subcritical stenosis no further invasive testing or surgery at this time.  Duplex ultrasound shows 40-59% stenosis of right internal carotid artery with 1 to 39%  stenosis of left carotid artery.  Continue antiplatelet therapy as prescribed Continue management of CAD, HTN and Hyperlipidemia Healthy heart diet,  encouraged exercise at least 4  times per week Follow up in 12 months with duplex ultrasound and physical exam   2. Benign essential hypertension Continue antihypertensive medications as already ordered, these medications have been reviewed and there are no changes at this time.   3. Pure hypercholesterolemia Continue statin as ordered.  Due to patient's carotid disease will increase pravastatin to better manage cholesterol progression  - pravastatin (PRAVACHOL) 40 MG tablet; Take 1 tablet (40 mg total) by mouth daily.  Dispense: 90 tablet; Refill: 3    Current Outpatient Medications on File Prior to Visit  Medication Sig Dispense Refill   Ascorbic Acid (VITAMIN C) 1000 MG tablet Take 1,000 mg by mouth 3 (three) times a week.      Calcium 500-125 MG-UNIT TABS Take 1 tablet by mouth daily.     Cholecalciferol (VITAMIN D3) 2000 units TABS Take 1 tablet by mouth 3 (three) times a week.     Cyanocobalamin (VITAMIN B-12) 5000 MCG TBDP Take 5,000 mcg by mouth 3 (three) times a week.     fluticasone (FLONASE) 50 MCG/ACT nasal spray Place 2 sprays into both nostrils daily.     furosemide (LASIX) 20 MG tablet Take 20 mg by mouth daily as needed for fluid.     gentamicin ointment (GARAMYCIN) 0.1 %      glipiZIDE (GLUCOTROL XL) 5 MG 24 hr tablet Take 5 mg by mouth daily.     glipiZIDE (GLUCOTROL) 10 MG tablet Take 10 mg by mouth daily before breakfast.     glucose blood (CONTOUR NEXT TEST) test strip USE ONCE DAILY AS DIRECTED     Hypromellose (NATURAL BALANCE TEARS OP) Place 1-2 drops into both eyes 3 (three) times daily as needed (for dry/irritated eyes).     KRILL OIL PO Take 1 capsule by mouth See admin instructions. 2-3x's a week     Lancets (ACCU-CHEK MULTICLIX) lancets USE AS DIRECTED     latanoprost (XALATAN) 0.005 %  ophthalmic solution Place 1 drop into both eyes at bedtime.     Loratadine (CLARITIN) 10 MG CAPS Take 1 tablet by mouth daily.     MAGNESIUM PO Take 150 mg by mouth 3 (three) times a week.     metFORMIN (GLUCOPHAGE-XR) 500 MG 24 hr tablet TAKE 2 TABLETS BY MOUTH EVERY DAY     metFORMIN (GLUCOPHAGE-XR) 500 MG 24 hr tablet Take 1,000 mg by mouth daily.     methocarbamol (ROBAXIN) 750 MG tablet Take 1 tablet by mouth 2 (two) times daily.     metoprolol succinate (TOPROL-XL) 50 MG 24 hr tablet Take by mouth.     olmesartan (BENICAR) 40 MG tablet Take by mouth.     Omega-3 Fatty Acids (FISH OIL) 1000 MG CAPS Take 1 tablet by mouth daily.     terconazole (TERAZOL 7) 0.4 % vaginal cream INSERT 1 APPLICATORFUL VAGINALLY EVERY NIGHT AT BEDTIME FOR 7 NIGHTS 45 g 2   tobramycin (TOBREX) 0.3 % ophthalmic solution 1 drop every 4 (four) hours.     triamcinolone cream (KENALOG) 0.1 % Apply 1 application topically as needed.     valACYclovir (VALTREX) 1000 MG tablet      No current facility-administered medications on file prior to visit.    There are no Patient Instructions on file for this visit. No follow-ups on file.   Kris Hartmann, NP

## 2020-07-28 ENCOUNTER — Encounter (INDEPENDENT_AMBULATORY_CARE_PROVIDER_SITE_OTHER): Payer: Self-pay

## 2020-09-27 ENCOUNTER — Other Ambulatory Visit: Payer: Self-pay | Admitting: Internal Medicine

## 2020-09-27 DIAGNOSIS — Z1231 Encounter for screening mammogram for malignant neoplasm of breast: Secondary | ICD-10-CM

## 2020-10-28 ENCOUNTER — Other Ambulatory Visit: Payer: Self-pay

## 2020-10-28 ENCOUNTER — Ambulatory Visit
Admission: RE | Admit: 2020-10-28 | Discharge: 2020-10-28 | Disposition: A | Discharge status: Home / Self Care | Payer: Medicare Other | Source: Ambulatory Visit | Attending: Internal Medicine | Admitting: Internal Medicine

## 2020-10-28 DIAGNOSIS — Z1231 Encounter for screening mammogram for malignant neoplasm of breast: Secondary | ICD-10-CM | POA: Diagnosis not present

## 2020-11-22 ENCOUNTER — Encounter: Payer: Self-pay | Admitting: Obstetrics and Gynecology

## 2020-11-22 ENCOUNTER — Other Ambulatory Visit: Payer: Self-pay

## 2020-11-22 ENCOUNTER — Ambulatory Visit (INDEPENDENT_AMBULATORY_CARE_PROVIDER_SITE_OTHER): Payer: Medicare Other | Admitting: Obstetrics and Gynecology

## 2020-11-22 VITALS — BP 128/72 | Ht 62.0 in | Wt 168.0 lb

## 2020-11-22 DIAGNOSIS — Z01419 Encounter for gynecological examination (general) (routine) without abnormal findings: Secondary | ICD-10-CM | POA: Diagnosis not present

## 2020-11-22 DIAGNOSIS — Z0000 Encounter for general adult medical examination without abnormal findings: Secondary | ICD-10-CM

## 2020-11-22 DIAGNOSIS — Z1239 Encounter for other screening for malignant neoplasm of breast: Secondary | ICD-10-CM | POA: Diagnosis not present

## 2020-11-22 MED ORDER — TERCONAZOLE 0.4 % VA CREA: TOPICAL_CREAM | VAGINAL | 2 refills | Status: DC

## 2020-11-22 NOTE — Progress Notes (Signed)
Gynecology Annual Exam  PCP: Idelle Crouch, MD  Chief Complaint:  Chief Complaint  Patient presents with  . Gynecologic Exam    Annual - RM 5    History of Present Illness:Patient is a 69 y.o. [id number] presents for annual exam. The patient has no complaints today.   LMP: No LMP recorded. Patient is postmenopausal. No postmenopausal bleeding  The patient is sexually active. She denies dyspareunia.  The patient does perform self breast exams.  There is no notable family history of breast or ovarian cancer in her family.  The patient wears seatbelts: yes.   The patient has regular exercise: not asked.    The patient denies current symptoms of depression.     Review of Systems: Review of Systems  Constitutional: Negative for chills and fever.  HENT: Negative for congestion.   Respiratory: Negative for cough and shortness of breath.   Cardiovascular: Negative for chest pain and palpitations.  Gastrointestinal: Negative for abdominal pain, constipation, diarrhea, heartburn, nausea and vomiting.  Genitourinary: Negative for dysuria, frequency and urgency.  Skin: Negative for itching and rash.  Neurological: Negative for dizziness and headaches.  Endo/Heme/Allergies: Negative for polydipsia.  Psychiatric/Behavioral: Negative for depression.    Past Medical History:  Patient Active Problem List   Diagnosis Date Noted  . Osteopenia after menopause 11/23/2019  . Benign essential hypertension 10/07/2019  . Chronic kidney disease, stage II (mild) 10/07/2019  . Cervical spondylosis 05/30/2019  . Nontraumatic complete tear of right rotator cuff 05/30/2019  . Rotator cuff tendinitis, left 05/30/2019  . Rotator cuff tendinitis, right 05/30/2019  . Diabetic nephropathy ([place]) 10/26/2016  . Hyperglycemia, unspecified 10/26/2016  . Hypertension 10/26/2016    Overview:  benign   . Iron deficiency anemia 10/26/2016  . Menorrhagia 10/26/2016    Overview:  Chronic   .  Nephrolithiasis 10/26/2016  . Nocturnal leg cramps 10/26/2016  . Palpitations 10/26/2016  . Renal insufficiency 10/26/2016  . Tachycardia 10/26/2016  . Generalized OA 03/29/2016  . Diabetes type 2, controlled ([place]) 11/08/2015  . H/O adenomatous polyp of colon 10/15/2014  . Pure hypercholesterolemia 09/11/2014    Past Surgical History:  Past Surgical History:  Procedure Laterality Date  . CARDIAC CATHETERIZATION  2009  . CATARACT EXTRACTION Right October 2013  . COLONOSCOPY  2016   normal  . COLONOSCOPY WITH PROPOFOL N/A 04/22/2020   Procedure: COLONOSCOPY WITH PROPOFOL;  Surgeon: Jonathon Bellows, MD;  Location: [facility] ENDOSCOPY;  Service: Gastroenterology;  Laterality: N/A;  . CYSTOSCOPY WITH BIOPSY N/A 01/02/2017   Procedure: CYSTOSCOPY WITH BIOPSY;  Surgeon: Hollice Espy, MD;  Location: [facility] ORS;  Service: Urology;  Laterality: N/A;  . DILATION AND CURETTAGE OF UTERUS  ~2000   bleeding issues; MAD  . ENDOMETRIAL BIOPSY  1996/ 2000  . HYSTEROSCOPY WITH D & C  01/14/1998   cystic hyperplasia  . TONSILLECTOMY    . URETEROSCOPY WITH HOLMIUM LASER LITHOTRIPSY      Gynecologic History:  No LMP recorded. Patient is postmenopausal. Last Pap: Results were: 11/19/2019 no abnormalities  Last mammogram: 10/28/2020 Results were: Gillian Shields I  Obstetric History: [id number]  Family History:  Family History  Problem Relation Age of Onset  . Diabetes Mother   . Hypertension Mother   . Diabetes Father   . Heart disease Father        coronary artery disease  . Hyperlipidemia Father   . Diabetes Sister   . Hypertension Sister   . Hyperlipidemia Sister   . Non-Hodgkin's lymphoma  Sister 8  . Diabetes Brother   . Hypertension Brother   . Hyperlipidemia Brother   . Hypertension Sister   . Hyperlipidemia Sister   . Hyperlipidemia Sister   . Melanoma Daughter 8  . Cancer Daughter        rare form of sarcoma on her leg  . Bladder Cancer Neg Hx   . Prostate cancer Neg Hx   . Kidney cancer Neg  Hx   . Breast cancer Neg Hx     Social History:  Social History   Socioeconomic History  . Marital status: Married    Spouse name: Not on file  . Number of children: 5  . Years of education: 71  . Highest education level: Not on file  Occupational History  . Occupation: Therapist, sports  Tobacco Use  . Smoking status: Former Smoker    Years: 1.50    Quit date: 04/19/1972    Years since quitting: 48.6  . Smokeless tobacco: Never Used  Vaping Use  . Vaping Use: Never used  Substance and Sexual Activity  . Alcohol use: Yes    Alcohol/week: 0.0 standard drinks    Comment: rare  . Drug use: No  . Sexual activity: Yes    Partners: Male    Birth control/protection: Post-menopausal  Other Topics Concern  . Not on file  Social History Narrative   Has 12 grandchildren (2019)   Social Determinants of Radio broadcast assistant Strain: Not on file  Food Insecurity: Not on file  Transportation Needs: Not on file  Physical Activity: Not on file  Stress: Not on file  Social Connections: Not on file  Intimate Partner Violence: Not on file    Allergies:  No Known Allergies  Medications: Prior to Admission medications   Medication Sig Start Date End Date Taking? Authorizing Provider  Ascorbic Acid (VITAMIN C) 1000 MG tablet Take 1,000 mg by mouth 3 (three) times a week.    Yes [provider]  Calcium 500-125 MG-UNIT TABS Take 1 tablet by mouth daily.   Yes [provider]  Cholecalciferol (VITAMIN D3) 2000 units TABS Take 1 tablet by mouth 3 (three) times a week.   Yes [provider]  Cyanocobalamin (VITAMIN B-12) 5000 MCG TBDP Take 5,000 mcg by mouth 3 (three) times a week.   Yes [provider]  furosemide (LASIX) 20 MG tablet Take 20 mg by mouth daily as needed for fluid.   Yes [provider]  glipiZIDE (GLUCOTROL) 10 MG tablet Take 10 mg by mouth daily before breakfast.   Yes [provider]  glucose blood test strip USE ONCE  DAILY AS DIRECTED 09/17/17  Yes [provider]  Hypromellose (NATURAL BALANCE TEARS OP) Place 1-2 drops into both eyes 3 (three) times daily as needed (for dry/irritated eyes).   Yes [provider]  KRILL OIL PO Take 1 capsule by mouth See admin instructions. 2-3x's a week   Yes [provider]  Lancets (ACCU-CHEK MULTICLIX) lancets USE AS DIRECTED 03/02/15  Yes [provider]  latanoprost (XALATAN) 0.005 % ophthalmic solution Place 1 drop into both eyes at bedtime.   Yes [provider]  Loratadine 10 MG CAPS Take 1 tablet by mouth daily.   Yes [provider]  MAGNESIUM PO Take 150 mg by mouth 3 (three) times a week.   Yes [provider]  metFORMIN (GLUCOPHAGE-XR) 500 MG 24 hr tablet TAKE 2 TABLETS BY MOUTH EVERY DAY 01/30/20  Yes [provider]  metFORMIN (GLUCOPHAGE-XR) 500 MG 24 hr tablet Take 1,000 mg by mouth daily. 01/30/20  Yes [provider]  methocarbamol (ROBAXIN) 750 MG tablet Take 1 tablet by mouth 2 (two) times daily. 11/09/19  Yes [provider]  metoprolol succinate (TOPROL-XL) 50 MG 24 hr tablet Take by mouth. 02/01/20  Yes [provider]  olmesartan (BENICAR) 40 MG tablet Take by mouth. 02/05/20  Yes [provider]  Omega-3 Fatty Acids (FISH OIL) 1000 MG CAPS Take 1 tablet by mouth daily.   Yes [provider]  pravastatin (PRAVACHOL) 40 MG tablet Take 1 tablet (40 mg total) by mouth daily. 06/22/20  Yes Kris Hartmann, NP  terconazole (TERAZOL 7) 0.4 % vaginal cream INSERT 1 APPLICATORFUL VAGINALLY EVERY NIGHT AT BEDTIME FOR 7 NIGHTS 11/12/18  Yes Dalia Heading, CNM  fluticasone [facility]) 50 MCG/ACT nasal spray Place 2 sprays into both nostrils daily. Patient not taking: Reported on 11/22/2020 01/08/20   [provider]  gentamicin ointment (GARAMYCIN) 0.1 %  01/08/20   [provider]  tobramycin (TOBREX) 0.3 % ophthalmic solution 1 drop  every 4 (four) hours. Patient not taking: Reported on 11/22/2020 12/08/19   [provider]  triamcinolone cream (KENALOG) 0.1 % Apply 1 application topically as needed. Patient not taking: Reported on 11/22/2020 08/12/19   [provider]  valACYclovir (VALTREX) 1000 MG tablet  01/08/20   [provider]    Physical Exam Vitals: Blood pressure 128/72, height 5\' 2"  (1.575 m), weight 168 lb (76.2 kg).  General: NAD HEENT: normocephalic, anicteric Thyroid: no enlargement, no palpable nodules Pulmonary: No increased work of breathing, CTAB Cardiovascular: RRR, distal pulses 2+ Breast: Breast symmetrical, no tenderness, no palpable nodules or masses, no skin or nipple retraction present, no nipple discharge.  No axillary or supraclavicular lymphadenopathy. Abdomen: NABS, soft, non-tender, non-distended.  Umbilicus without lesions.  No hepatomegaly, splenomegaly or masses palpable. No evidence of hernia  Genitourinary:  External: Normal external female genitalia.  Normal urethral meatus, normal Bartholin's and Skene's glands.    Vagina: Normal vaginal mucosa, no evidence of prolapse.    Cervix: Grossly normal in appearance, no bleeding  Uterus: Non-enlarged, mobile, normal contour.  No CMT  Adnexa: ovaries non-enlarged, no adnexal masses  Rectal: deferred  Lymphatic: no evidence of inguinal lymphadenopathy Extremities: no edema, erythema, or tenderness Neurologic: Grossly intact Psychiatric: mood appropriate, affect full  Female chaperone present for pelvic and breast  portions of the physical exam     Assessment: 69 y.o. [id number] routine annual exam  Plan: Problem List Items Addressed This Visit   None   Visit Diagnoses    Encounter for gynecological examination without abnormal finding    -  Primary   Breast screening          1) Mammogram - recommend yearly screening mammogram.  Mammogram Is up to date  2) STI screening  was notoffered and therefore  not obtained  3) ASCCP guidelines and rational discussed.  Patient opts for every 2 years screening interval  4) Osteoporosis  - per USPTF routine screening DEXA at age 6 DEXA scan obtained 11/19/2018. Results: femoral neck T score -1.7 and total hip was normal T score. Frax scores were 1.4% and 9.8%  Consider FDA-approved medical therapies in postmenopausal women and men aged 10 years and older, based on the following: a) A hip or vertebral (clinical or morphometric) fracture b) T-score ? -2.5 at the femoral neck or spine after appropriate evaluation to  exclude secondary causes C) Low bone mass (T-score between -1.0 and -2.5 at the femoral neck or spine) and a 10-year probability of a hip fracture ? 3% or a 10-year probability of a major osteoporosis-related fracture ? 20% based on the [place]-adapted WHO algorithm  5) Routine healthcare maintenance including cholesterol, diabetes screening discussed managed by PCP Dr. Doy Hutching  6) Colonoscopy - UTD 04/22/2020  7) Return in about 1 year (around 11/22/2021) for annual.    Malachy Mood, MD Mosetta Pigeon, [facility] 11/22/2020, 10:35 AM

## 2021-03-23 ENCOUNTER — Other Ambulatory Visit: Payer: Self-pay

## 2021-03-23 ENCOUNTER — Encounter: Payer: Self-pay | Admitting: Ophthalmology

## 2021-03-28 NOTE — Discharge Instructions (Signed)

## 2021-03-30 ENCOUNTER — Ambulatory Visit: Payer: Medicare Other | Admitting: Anesthesiology

## 2021-03-30 ENCOUNTER — Encounter
Admission: RE | Disposition: A | Discharge status: Home / Self Care | Payer: Self-pay | Source: Ambulatory Visit | Attending: Ophthalmology

## 2021-03-30 ENCOUNTER — Ambulatory Visit
Admission: RE | Admit: 2021-03-30 | Discharge: 2021-03-30 | Disposition: A | Discharge status: Home / Self Care | Payer: Medicare Other | Source: Ambulatory Visit | Attending: Ophthalmology | Admitting: Ophthalmology

## 2021-03-30 ENCOUNTER — Encounter: Payer: Self-pay | Admitting: Ophthalmology

## 2021-03-30 ENCOUNTER — Other Ambulatory Visit: Payer: Self-pay

## 2021-03-30 DIAGNOSIS — E1136 Type 2 diabetes mellitus with diabetic cataract: Secondary | ICD-10-CM | POA: Diagnosis present

## 2021-03-30 DIAGNOSIS — Z6831 Body mass index (BMI) 31.0-31.9, adult: Secondary | ICD-10-CM | POA: Diagnosis not present

## 2021-03-30 DIAGNOSIS — E785 Hyperlipidemia, unspecified: Secondary | ICD-10-CM | POA: Insufficient documentation

## 2021-03-30 DIAGNOSIS — E669 Obesity, unspecified: Secondary | ICD-10-CM | POA: Insufficient documentation

## 2021-03-30 DIAGNOSIS — Z87442 Personal history of urinary calculi: Secondary | ICD-10-CM | POA: Diagnosis not present

## 2021-03-30 DIAGNOSIS — H2512 Age-related nuclear cataract, left eye: Secondary | ICD-10-CM | POA: Insufficient documentation

## 2021-03-30 DIAGNOSIS — Z833 Family history of diabetes mellitus: Secondary | ICD-10-CM | POA: Diagnosis not present

## 2021-03-30 DIAGNOSIS — N183 Chronic kidney disease, stage 3 unspecified: Secondary | ICD-10-CM | POA: Insufficient documentation

## 2021-03-30 DIAGNOSIS — Z7984 Long term (current) use of oral hypoglycemic drugs: Secondary | ICD-10-CM | POA: Diagnosis not present

## 2021-03-30 DIAGNOSIS — I129 Hypertensive chronic kidney disease with stage 1 through stage 4 chronic kidney disease, or unspecified chronic kidney disease: Secondary | ICD-10-CM | POA: Insufficient documentation

## 2021-03-30 DIAGNOSIS — Z79899 Other long term (current) drug therapy: Secondary | ICD-10-CM | POA: Diagnosis not present

## 2021-03-30 DIAGNOSIS — Z8249 Family history of ischemic heart disease and other diseases of the circulatory system: Secondary | ICD-10-CM | POA: Diagnosis not present

## 2021-03-30 DIAGNOSIS — Z87891 Personal history of nicotine dependence: Secondary | ICD-10-CM | POA: Diagnosis not present

## 2021-03-30 DIAGNOSIS — E1122 Type 2 diabetes mellitus with diabetic chronic kidney disease: Secondary | ICD-10-CM | POA: Insufficient documentation

## 2021-03-30 HISTORY — PX: CATARACT EXTRACTION W/PHACO: SHX586

## 2021-03-30 LAB — GLUCOSE, CAPILLARY
Glucose-Capillary: 113 mg/dL — ABNORMAL HIGH (ref 70–99)
Glucose-Capillary: 118 mg/dL — ABNORMAL HIGH (ref 70–99)

## 2021-03-30 SURGERY — PHACOEMULSIFICATION, CATARACT, WITH IOL INSERTION
Anesthesia: Monitor Anesthesia Care | Site: Eye | Laterality: Left

## 2021-03-30 MED ORDER — TETRACAINE HCL 0.5 % OP SOLN
1.0000 [drp] | OPHTHALMIC | Status: DC | PRN
  Administered 2021-03-30 (×3): 1 [drp] via OPHTHALMIC

## 2021-03-30 MED ORDER — NA HYALUR & NA CHOND-NA HYALUR 0.4-0.35 ML IO KIT
PACK | INTRAOCULAR | Status: DC | PRN
  Administered 2021-03-30: 1 mL via INTRAOCULAR

## 2021-03-30 MED ORDER — ARMC OPHTHALMIC DILATING DROPS
1.0000 "application " | OPHTHALMIC | Status: DC | PRN
  Administered 2021-03-30 (×3): 1 via OPHTHALMIC

## 2021-03-30 MED ORDER — BRIMONIDINE TARTRATE-TIMOLOL 0.2-0.5 % OP SOLN
OPHTHALMIC | Status: DC | PRN
  Administered 2021-03-30: 1 [drp] via OPHTHALMIC

## 2021-03-30 MED ORDER — EPINEPHRINE PF 1 MG/ML IJ SOLN
INTRAOCULAR | Status: DC | PRN
  Administered 2021-03-30: 80 mL via OPHTHALMIC

## 2021-03-30 MED ORDER — ACETAMINOPHEN 325 MG PO TABS: 325.0000 mg | ORAL_TABLET | Freq: Once | ORAL | Status: DC

## 2021-03-30 MED ORDER — MOXIFLOXACIN HCL 0.5 % OP SOLN
OPHTHALMIC | Status: DC | PRN
  Administered 2021-03-30: 0.2 mL via OPHTHALMIC

## 2021-03-30 MED ORDER — LIDOCAINE HCL (PF) 2 % IJ SOLN
INTRAOCULAR | Status: DC | PRN
  Administered 2021-03-30: 1 mL

## 2021-03-30 MED ORDER — FENTANYL CITRATE (PF) 100 MCG/2ML IJ SOLN
INTRAMUSCULAR | Status: DC | PRN
  Administered 2021-03-30 (×2): 50 ug via INTRAVENOUS

## 2021-03-30 MED ORDER — LACTATED RINGERS IV SOLN: INTRAVENOUS | Status: DC

## 2021-03-30 MED ORDER — MIDAZOLAM HCL 2 MG/2ML IJ SOLN
INTRAMUSCULAR | Status: DC | PRN
  Administered 2021-03-30: 2 mg via INTRAVENOUS

## 2021-03-30 MED ORDER — ACETAMINOPHEN 160 MG/5ML PO SOLN: 325.0000 mg | Freq: Once | ORAL | Status: DC

## 2021-03-30 SURGICAL SUPPLY — 24 items
CANNULA ANT/CHMB 27GA (MISCELLANEOUS) ×2 IMPLANT
GLOVE SURG ENC TEXT LTX SZ7.5 (GLOVE) ×4 IMPLANT
GLOVE SURG TRIUMPH 8.0 PF LTX (GLOVE) ×2 IMPLANT
GOWN STRL REUS W/ TWL LRG LVL3 (GOWN DISPOSABLE) ×2 IMPLANT
GOWN STRL REUS W/TWL LRG LVL3 (GOWN DISPOSABLE) ×4
LENS IOL TECNIS EYHANCE 19.5 (Intraocular Lens) ×2 IMPLANT
MARKER SKIN DUAL TIP RULER LAB (MISCELLANEOUS) ×2 IMPLANT
NDL RETROBULBAR .5 NSTRL (NEEDLE) IMPLANT
NEEDLE CAPSULORHEX 25GA (NEEDLE) ×2 IMPLANT
NEEDLE FILTER BLUNT 18X 1/2SAF (NEEDLE) ×2
NEEDLE FILTER BLUNT 18X1 1/2 (NEEDLE) ×2 IMPLANT
PACK CATARACT BRASINGTON (MISCELLANEOUS) ×2 IMPLANT
PACK EYE AFTER SURG (MISCELLANEOUS) ×2 IMPLANT
PACK OPTHALMIC (MISCELLANEOUS) ×2 IMPLANT
RING MALYGIN 7.0 (MISCELLANEOUS) IMPLANT
SOLUTION OPHTHALMIC SALT (MISCELLANEOUS) ×2 IMPLANT
SUT ETHILON 10-0 CS-B-6CS-B-6 (SUTURE)
SUT VICRYL  9 0 (SUTURE)
SUT VICRYL 9 0 (SUTURE) IMPLANT
SUTURE EHLN 10-0 CS-B-6CS-B-6 (SUTURE) IMPLANT
SYR 3ML LL SCALE MARK (SYRINGE) ×4 IMPLANT
SYR TB 1ML LUER SLIP (SYRINGE) ×2 IMPLANT
WATER STERILE IRR 250ML POUR (IV SOLUTION) ×2 IMPLANT
WIPE NON LINTING 3.25X3.25 (MISCELLANEOUS) ×2 IMPLANT

## 2021-03-30 NOTE — Anesthesia Preprocedure Evaluation (Signed)
Anesthesia Evaluation  Patient identified by MRN, date of birth, ID band Patient awake    Reviewed: Allergy & Precautions, H&P , NPO status , Patient's Chart, lab work & pertinent test results  Airway Mallampati: II  TM Distance: >3 FB Neck ROM: full    Dental no notable dental hx.    Pulmonary former smoker,    Pulmonary exam normal breath sounds clear to auscultation       Cardiovascular hypertension, Normal cardiovascular exam Rhythm:regular Rate:Normal     Neuro/Psych    GI/Hepatic   Endo/Other  diabetes  Renal/GU      Musculoskeletal   Abdominal   Peds  Hematology   Anesthesia Other Findings   Reproductive/Obstetrics                             Anesthesia Physical Anesthesia Plan  ASA: II  Anesthesia Plan: MAC   Post-op Pain Management:    Induction:   PONV Risk Score and Plan: 2 and Treatment may vary due to age or medical condition, TIVA and Midazolam  Airway Management Planned:   Additional Equipment:   Intra-op Plan:   Post-operative Plan:   Informed Consent: I have reviewed the patients History and Physical, chart, labs and discussed the procedure including the risks, benefits and alternatives for the proposed anesthesia with the patient or authorized representative who has indicated his/her understanding and acceptance.     Dental Advisory Given  Plan Discussed with: CRNA  Anesthesia Plan Comments:         Anesthesia Quick Evaluation

## 2021-03-30 NOTE — Transfer of Care (Signed)
Immediate Anesthesia Transfer of Care Note  Patient: Cheryl Kirk  Procedure(s) Performed: CATARACT EXTRACTION PHACO AND INTRAOCULAR LENS PLACEMENT (IOC) LEFT DIABETIC (Left Eye)  Patient Location: PACU  Anesthesia Type: MAC  Level of Consciousness: awake, alert  and patient cooperative  Airway and Oxygen Therapy: Patient Spontanous Breathing and Patient connected to supplemental oxygen  Post-op Assessment: Post-op Vital signs reviewed, Patient's Cardiovascular Status Stable, Respiratory Function Stable, Patent Airway and No signs of Nausea or vomiting  Post-op Vital Signs: Reviewed and stable  Complications: No complications documented.

## 2021-03-30 NOTE — Anesthesia Procedure Notes (Signed)
Procedure Name: MAC Date/Time: 03/30/2021 8:17 AM Performed by: Silvana Newness, CRNA Pre-anesthesia Checklist: Patient identified, Emergency Drugs available, Suction available, Patient being monitored and Timeout performed Patient Re-evaluated:Patient Re-evaluated prior to induction Oxygen Delivery Method: Nasal cannula Placement Confirmation: positive ETCO2

## 2021-03-30 NOTE — H&P (Signed)
[facility]   Primary Care Physician:  Idelle Crouch, MD Ophthalmologist: Dr. Leandrew Koyanagi  Pre-Procedure History & Physical: HPI:  Cheryl Kirk is a 70 y.o. female here for ophthalmic surgery.   Past Medical History:  Diagnosis Date  . Chronic kidney disease 2012   stage 3  Dr, Jabier Mutton  . Diabetes mellitus without complication ([place])   . Fibroids   . Heart murmur    needs no follow up  . History of degenerative disc disease    spinal stenosis  . History of kidney stones    many years ago  . Hyperlipidemia   . Hypertension    BP and occular  . Kidney stones   . Menorrhagia   . Microscopic hematuria   . Obesity   . Ocular hypertension     Past Surgical History:  Procedure Laterality Date  . CARDIAC CATHETERIZATION  2009  . CATARACT EXTRACTION Right October 2013  . COLONOSCOPY  2016   normal  . COLONOSCOPY WITH PROPOFOL N/A 04/22/2020   Procedure: COLONOSCOPY WITH PROPOFOL;  Surgeon: Jonathon Bellows, MD;  Location: [facility] ENDOSCOPY;  Service: Gastroenterology;  Laterality: N/A;  . CYSTOSCOPY WITH BIOPSY N/A 01/02/2017   Procedure: CYSTOSCOPY WITH BIOPSY;  Surgeon: Hollice Espy, MD;  Location: [facility] ORS;  Service: Urology;  Laterality: N/A;  . DILATION AND CURETTAGE OF UTERUS  ~2000   bleeding issues; MAD  . ENDOMETRIAL BIOPSY  1996/ 2000  . HYSTEROSCOPY WITH D & C  01/14/1998   cystic hyperplasia  . TONSILLECTOMY    . URETEROSCOPY WITH HOLMIUM LASER LITHOTRIPSY      Prior to Admission medications   Medication Sig Start Date End Date Taking? Authorizing Provider  Alpha-Lipoic Acid 600 MG TABS Take by mouth daily.   Yes [provider]  Ascorbic Acid (VITAMIN C) 1000 MG tablet Take 1,000 mg by mouth 3 (three) times a week.    Yes [provider]  Calcium 500-125 MG-UNIT TABS Take 1 tablet by mouth daily.   Yes [provider]  Cholecalciferol (VITAMIN D3) 2000 units TABS Take 1 tablet by mouth 3 (three) times a week.   Yes  [provider]  Coenzyme Q10 (COQ10) 100 MG CAPS Take by mouth daily.   Yes [provider]  Cyanocobalamin (VITAMIN B-12) 5000 MCG TBDP Take 5,000 mcg by mouth 3 (three) times a week.   Yes [provider]  furosemide (LASIX) 20 MG tablet Take 20 mg by mouth daily as needed for fluid.   Yes [provider]  glipiZIDE (GLUCOTROL) 10 MG tablet Take 10 mg by mouth daily before breakfast.   Yes [provider]  Hypromellose (NATURAL BALANCE TEARS OP) Place 1-2 drops into both eyes 3 (three) times daily as needed (for dry/irritated eyes).   Yes [provider]  KRILL OIL PO Take 1 capsule by mouth See admin instructions. 2-3x's a week   Yes [provider]  latanoprost (XALATAN) 0.005 % ophthalmic solution Place 1 drop into both eyes at bedtime.   Yes [provider]  Loratadine 10 MG CAPS Take 1 tablet by mouth daily.   Yes [provider]  MAGNESIUM PO Take 400 mg by mouth daily.   Yes [provider]  metFORMIN (GLUCOPHAGE-XR) 500 MG 24 hr tablet TAKE 2 TABLETS BY MOUTH EVERY DAY 01/30/20  Yes [provider]  metFORMIN (GLUCOPHAGE-XR) 500 MG 24 hr tablet Take 1,000 mg by mouth daily. 01/30/20  Yes [provider]  methocarbamol (ROBAXIN) 750 MG tablet Take 1 tablet by mouth daily. 11/09/19  Yes [provider]  metoprolol succinate (TOPROL-XL) 50 MG 24 hr tablet Take by mouth. 02/01/20  Yes [provider]  olmesartan (BENICAR) 40 MG tablet Take by mouth. 02/05/20  Yes [provider]  pravastatin (PRAVACHOL) 40 MG tablet Take 1 tablet (40 mg total) by mouth daily. 06/22/20  Yes Kris Hartmann, NP  RESVERATROL PO Take 1,450 mg by mouth daily.   Yes [provider]  TURMERIC PO Take by mouth daily.   Yes [provider]  gentamicin ointment (GARAMYCIN) 0.1 %  01/08/20   [provider]  glucose blood test strip USE ONCE DAILY AS DIRECTED  09/17/17   [provider]  Lancets (ACCU-CHEK MULTICLIX) lancets USE AS DIRECTED 03/02/15   [provider]  terconazole (TERAZOL 7) 0.4 % vaginal cream INSERT 1 APPLICATORFUL VAGINALLY EVERY NIGHT AT BEDTIME FOR 7 NIGHTS 11/22/20   Malachy Mood, MD  tobramycin (TOBREX) 0.3 % ophthalmic solution 1 drop every 4 (four) hours. Patient not taking: Reported on 11/22/2020 12/08/19   [provider]  triamcinolone cream (KENALOG) 0.1 % Apply 1 application topically as needed. Patient not taking: Reported on 11/22/2020 08/12/19   [provider]    Allergies as of 02/01/2021  . (No Known Allergies)    Family History  Problem Relation Age of Onset  . Diabetes Mother   . Hypertension Mother   . Diabetes Father   . Heart disease Father        coronary artery disease  . Hyperlipidemia Father   . Diabetes Sister   . Hypertension Sister   . Hyperlipidemia Sister   . Non-Hodgkin's lymphoma Sister 40  . Diabetes Brother   . Hypertension Brother   . Hyperlipidemia Brother   . Hypertension Sister   . Hyperlipidemia Sister   . Hyperlipidemia Sister   . Melanoma Daughter 69  . Cancer Daughter        rare form of sarcoma on her leg  . Bladder Cancer Neg Hx   . Prostate cancer Neg Hx   . Kidney cancer Neg Hx   . Breast cancer Neg Hx     Social History   Socioeconomic History  . Marital status: Married    Spouse name: Not on file  . Number of children: 5  . Years of education: 61  . Highest education level: Not on file  Occupational History  . Occupation: Therapist, sports  Tobacco Use  . Smoking status: Former Smoker    Years: 1.50    Quit date: 04/19/1972    Years since quitting: 48.9  . Smokeless tobacco: Never Used  Vaping Use  . Vaping Use: Never used  Substance and Sexual Activity  . Alcohol use: Yes    Alcohol/week: 0.0 standard drinks    Comment: rare  . Drug use: No  . Sexual activity: Yes    Partners: Male    Birth control/protection:  Post-menopausal  Other Topics Concern  . Not on file  Social History Narrative   Has 12 grandchildren (2019)   Social Determinants of Radio broadcast assistant Strain: Not on file  Food Insecurity: Not on file  Transportation Needs: Not on file  Physical Activity: Not on file  Stress: Not on file  Social Connections: Not on file  Intimate Partner Violence: Not on file    Review of Systems: See HPI, otherwise negative ROS  Physical Exam: BP (!) 157/69  Pulse 68   Temp (!) 97.3 F (36.3 C) (Temporal)   Resp 18   Ht 5\' 2"  (1.575 m)   Wt 78.5 kg   SpO2 100%   BMI 31.64 kg/m  General:   Alert,  pleasant and cooperative in NAD Head:  Normocephalic and atraumatic. Lungs:  Clear to auscultation.    Heart:  Regular rate and rhythm.   Impression/Plan: Cheryl Kirk is here for ophthalmic surgery.  Risks, benefits, limitations, and alternatives regarding ophthalmic surgery have been reviewed with the patient.  Questions have been answered.  All parties agreeable.   Leandrew Koyanagi, MD  03/30/2021, 7:37 AM

## 2021-03-30 NOTE — Anesthesia Postprocedure Evaluation (Signed)
Anesthesia Post Note  Patient: Cheryl Kirk  Procedure(s) Performed: CATARACT EXTRACTION PHACO AND INTRAOCULAR LENS PLACEMENT (IOC) LEFT DIABETIC (Left Eye)     Patient location during evaluation: PACU Anesthesia Type: MAC Level of consciousness: awake and alert and oriented Pain management: satisfactory to patient Vital Signs Assessment: post-procedure vital signs reviewed and stable Respiratory status: spontaneous breathing, nonlabored ventilation and respiratory function stable Cardiovascular status: blood pressure returned to baseline and stable Postop Assessment: Adequate PO intake and No signs of nausea or vomiting Anesthetic complications: no   No complications documented.  Raliegh Ip

## 2021-03-30 NOTE — Op Note (Signed)
OPERATIVE NOTE  Cheryl Kirk [id number] 03/30/2021   PREOPERATIVE DIAGNOSIS:  Nuclear sclerotic cataract left eye. H25.12   POSTOPERATIVE DIAGNOSIS:    Nuclear sclerotic cataract left eye.     PROCEDURE:  Phacoemusification with posterior chamber intraocular lens placement of the left eye  Ultrasound time: Procedure(s) with comments: CATARACT EXTRACTION PHACO AND INTRAOCULAR LENS PLACEMENT (IOC) LEFT DIABETIC (Left) - 4.74 1:07.4 7.0%  LENS:   Implant Name Type Inv. Item Serial No. Manufacturer Lot No. LRB No. Used Action  LENS IOL TECNIS EYHANCE 19.5 - [id number] Intraocular Lens LENS IOL TECNIS EYHANCE 19.5 [id number] JOHNSON   Left 1 Implanted      SURGEON:  Wyonia Hough, MD   ANESTHESIA:  Topical with tetracaine drops and 2% Xylocaine jelly, augmented with 1% preservative-free intracameral lidocaine.    COMPLICATIONS:  None.   DESCRIPTION OF PROCEDURE:  The patient was identified in the holding room and transported to the operating room and placed in the supine position under the operating microscope.  The left eye was identified as the operative eye and it was prepped and draped in the usual sterile ophthalmic fashion.   A 1 millimeter clear-corneal paracentesis was made at the 1:30 position.  0.5 ml of preservative-free 1% lidocaine was injected into the anterior chamber.  The anterior chamber was filled with Viscoat viscoelastic.  A 2.4 millimeter keratome was used to make a near-clear corneal incision at the 10:30 position.  .  A curvilinear capsulorrhexis was made with a cystotome and capsulorrhexis forceps.  Balanced salt solution was used to hydrodissect and hydrodelineate the nucleus.   Phacoemulsification was then used in stop and chop fashion to remove the lens nucleus and epinucleus.  The remaining cortex was then removed using the irrigation and aspiration handpiece. Provisc was then placed into the capsular bag to distend it for lens placement.  A lens  was then injected into the capsular bag.  The remaining viscoelastic was aspirated.   Wounds were hydrated with balanced salt solution.  The anterior chamber was inflated to a physiologic pressure with balanced salt solution.  No wound leaks were noted. Vigamox 0.2 ml of a 1mg  per ml solution was injected into the anterior chamber for a dose of 0.2 mg of intracameral antibiotic at the completion of the case.   Timolol and Brimonidine drops were applied to the eye.  The patient was taken to the recovery room in stable condition without complications of anesthesia or surgery.  [name] 03/30/2021, 8:36 AM

## 2021-03-31 ENCOUNTER — Encounter: Payer: Self-pay | Admitting: Ophthalmology

## 2021-06-10 ENCOUNTER — Other Ambulatory Visit (INDEPENDENT_AMBULATORY_CARE_PROVIDER_SITE_OTHER): Payer: Self-pay | Admitting: Nurse Practitioner

## 2021-06-10 DIAGNOSIS — E7800 Pure hypercholesterolemia, unspecified: Secondary | ICD-10-CM

## 2021-06-10 NOTE — Telephone Encounter (Signed)
Is this ok to fill? 

## 2021-06-27 ENCOUNTER — Other Ambulatory Visit (INDEPENDENT_AMBULATORY_CARE_PROVIDER_SITE_OTHER): Payer: Self-pay | Admitting: Nurse Practitioner

## 2021-06-27 DIAGNOSIS — I6523 Occlusion and stenosis of bilateral carotid arteries: Secondary | ICD-10-CM

## 2021-06-28 ENCOUNTER — Other Ambulatory Visit: Payer: Self-pay

## 2021-06-28 ENCOUNTER — Encounter (INDEPENDENT_AMBULATORY_CARE_PROVIDER_SITE_OTHER): Payer: Self-pay | Admitting: Vascular Surgery

## 2021-06-28 ENCOUNTER — Ambulatory Visit (INDEPENDENT_AMBULATORY_CARE_PROVIDER_SITE_OTHER): Payer: Medicare Other

## 2021-06-28 ENCOUNTER — Ambulatory Visit (INDEPENDENT_AMBULATORY_CARE_PROVIDER_SITE_OTHER): Payer: Medicare Other | Admitting: Vascular Surgery

## 2021-06-28 VITALS — BP 137/78 | HR 65 | Resp 16 | Wt 175.6 lb

## 2021-06-28 DIAGNOSIS — I6523 Occlusion and stenosis of bilateral carotid arteries: Secondary | ICD-10-CM | POA: Diagnosis not present

## 2021-06-28 DIAGNOSIS — E7800 Pure hypercholesterolemia, unspecified: Secondary | ICD-10-CM

## 2021-06-28 DIAGNOSIS — E119 Type 2 diabetes mellitus without complications: Secondary | ICD-10-CM | POA: Diagnosis not present

## 2021-06-28 DIAGNOSIS — I10 Essential (primary) hypertension: Secondary | ICD-10-CM | POA: Diagnosis not present

## 2021-06-28 DIAGNOSIS — I6529 Occlusion and stenosis of unspecified carotid artery: Secondary | ICD-10-CM | POA: Insufficient documentation

## 2021-06-28 NOTE — Assessment & Plan Note (Signed)
Carotid duplex today reveals stable 1 to 39% ICA stenosis bilaterally.  No role for intervention.  On a statin agent.  Discussed potentially adding a baby aspirin daily or every other day to help slow progression as well.  Recheck in 1 year.

## 2021-06-28 NOTE — Assessment & Plan Note (Signed)
blood pressure control important in reducing the progression of atherosclerotic disease. On appropriate oral medications.  

## 2021-06-28 NOTE — Assessment & Plan Note (Signed)
blood glucose control important in reducing the progression of atherosclerotic disease. Also, involved in wound healing. On appropriate medications.  

## 2021-06-28 NOTE — Assessment & Plan Note (Signed)
lipid control important in reducing the progression of atherosclerotic disease. Continue statin therapy  

## 2021-06-28 NOTE — Progress Notes (Signed)
MRN : [id number]  Cheryl Kirk is a 70 y.o. (12-04-1951) female who presents with chief complaint of  Chief Complaint  Patient presents with   Carotid    1 yr follow up  .  History of Present Illness: Patient returns in follow-up of her carotid disease.  She was last seen about a year ago.  Since that time, she has not had any focal neurologic symptoms or other major issues. Specifically, the patient denies amaurosis fugax, speech or swallowing difficulties, or arm or leg weakness or numbness.  Carotid duplex today reveals stable 1 to 39% ICA stenosis bilaterally.  Current Outpatient Medications  Medication Sig Dispense Refill   Alpha-Lipoic Acid 600 MG TABS Take by mouth daily.     Ascorbic Acid (VITAMIN C) 1000 MG tablet Take 1,000 mg by mouth 3 (three) times a week.      Calcium 500-125 MG-UNIT TABS Take 1 tablet by mouth daily.     Cholecalciferol (VITAMIN D3) 2000 units TABS Take 1 tablet by mouth 3 (three) times a week.     Coenzyme Q10 (COQ10) 100 MG CAPS Take by mouth daily.     Cyanocobalamin (VITAMIN B-12) 5000 MCG TBDP Take 5,000 mcg by mouth 3 (three) times a week.     furosemide (LASIX) 20 MG tablet Take 20 mg by mouth daily as needed for fluid.     glipiZIDE (GLUCOTROL) 10 MG tablet Take 10 mg by mouth daily before breakfast.     glucose blood test strip USE ONCE DAILY AS DIRECTED     Hypromellose (NATURAL BALANCE TEARS OP) Place 1-2 drops into both eyes 3 (three) times daily as needed (for dry/irritated eyes).     KRILL OIL PO Take 1 capsule by mouth See admin instructions. 2-3x's a week     Lancets (ACCU-CHEK MULTICLIX) lancets USE AS DIRECTED     latanoprost (XALATAN) 0.005 % ophthalmic solution Place 1 drop into both eyes at bedtime.     Loratadine 10 MG CAPS Take 1 tablet by mouth daily.     MAGNESIUM PO Take 400 mg by mouth daily.     metFORMIN (GLUCOPHAGE-XR) 500 MG 24 hr tablet TAKE 2 TABLETS BY MOUTH EVERY DAY     metFORMIN (GLUCOPHAGE-XR) 500 MG 24 hr  tablet Take 1,000 mg by mouth daily.     methocarbamol (ROBAXIN) 750 MG tablet Take 1 tablet by mouth daily.     metoprolol succinate (TOPROL-XL) 50 MG 24 hr tablet Take by mouth.     olmesartan (BENICAR) 40 MG tablet Take by mouth.     pravastatin (PRAVACHOL) 40 MG tablet TAKE 1 TABLET(40 MG) BY MOUTH DAILY 90 tablet 3   RESVERATROL PO Take 1,450 mg by mouth daily.     terconazole (TERAZOL 7) 0.4 % vaginal cream INSERT 1 APPLICATORFUL VAGINALLY EVERY NIGHT AT BEDTIME FOR 7 NIGHTS 45 g 2   timolol (TIMOPTIC) 0.5 % ophthalmic solution 1 drop every morning.     TURMERIC PO Take by mouth daily.     gentamicin ointment (GARAMYCIN) 0.1 %  (Patient not taking: No sig reported)     tobramycin (TOBREX) 0.3 % ophthalmic solution 1 drop every 4 (four) hours. (Patient not taking: No sig reported)     triamcinolone cream (KENALOG) 0.1 % Apply 1 application topically as needed. (Patient not taking: No sig reported)     No current facility-administered medications for this visit.    Past Medical History:  Diagnosis Date   Chronic kidney  disease 2012   stage 3  Dr, Jabier Mutton   Diabetes mellitus without complication ([place])    Fibroids    Heart murmur    needs no follow up   History of degenerative disc disease    spinal stenosis   History of kidney stones    many years ago   Hyperlipidemia    Hypertension    BP and occular   Kidney stones    Menorrhagia    Microscopic hematuria    Obesity    Ocular hypertension     Past Surgical History:  Procedure Laterality Date   CARDIAC CATHETERIZATION  2009   CATARACT EXTRACTION Right October 2013   CATARACT EXTRACTION W/PHACO Left 03/30/2021   Procedure: CATARACT EXTRACTION PHACO AND INTRAOCULAR LENS PLACEMENT ([place]) LEFT DIABETIC;  Surgeon: Leandrew Koyanagi, MD;  Location: [place];  Service: Ophthalmology;  Laterality: Left;  4.74 1:07.4 7.0%   COLONOSCOPY  2016   normal   COLONOSCOPY WITH PROPOFOL N/A 04/22/2020   Procedure:  COLONOSCOPY WITH PROPOFOL;  Surgeon: Jonathon Bellows, MD;  Location: [facility] ENDOSCOPY;  Service: Gastroenterology;  Laterality: N/A;   CYSTOSCOPY WITH BIOPSY N/A 01/02/2017   Procedure: CYSTOSCOPY WITH BIOPSY;  Surgeon: Hollice Espy, MD;  Location: [facility] ORS;  Service: Urology;  Laterality: N/A;   DILATION AND CURETTAGE OF UTERUS  ~2000   bleeding issues; MAD   ENDOMETRIAL BIOPSY  1996/ 2000   HYSTEROSCOPY WITH D & C  01/14/1998   cystic hyperplasia   TONSILLECTOMY     URETEROSCOPY WITH HOLMIUM LASER LITHOTRIPSY       Social History   Tobacco Use   Smoking status: Former    Years: 1.50    Types: Cigarettes    Quit date: 04/19/1972    Years since quitting: 49.2   Smokeless tobacco: Never  Vaping Use   Vaping Use: Never used  Substance Use Topics   Alcohol use: Yes    Alcohol/week: 0.0 standard drinks    Comment: rare   Drug use: No       Family History  Problem Relation Age of Onset   Diabetes Mother    Hypertension Mother    Diabetes Father    Heart disease Father        coronary artery disease   Hyperlipidemia Father    Diabetes Sister    Hypertension Sister    Hyperlipidemia Sister    Non-Hodgkin's lymphoma Sister 56   Diabetes Brother    Hypertension Brother    Hyperlipidemia Brother    Hypertension Sister    Hyperlipidemia Sister    Hyperlipidemia Sister    Melanoma Daughter 66   Cancer Daughter        rare form of sarcoma on her leg   Bladder Cancer Neg Hx    Prostate cancer Neg Hx    Kidney cancer Neg Hx    Breast cancer Neg Hx      No Known Allergies   REVIEW OF SYSTEMS (Negative unless checked)  Constitutional: [] Weight loss  [] Fever  [] Chills Cardiac: [] Chest pain   [] Chest pressure   [] Palpitations   [] Shortness of breath when laying flat   [] Shortness of breath at rest   [] Shortness of breath with exertion. Vascular:  [] Pain in legs with walking   [] Pain in legs at rest   [] Pain in legs when laying flat   [] Claudication   [] Pain in feet when  walking  [] Pain in feet at rest  [] Pain in feet when laying flat   []   History of DVT   [] Phlebitis   [] Swelling in legs   [] Varicose veins   [] Non-healing ulcers Pulmonary:   [] Uses home oxygen   [] Productive cough   [] Hemoptysis   [] Wheeze  [] COPD   [] Asthma Neurologic:  [] Dizziness  [] Blackouts   [] Seizures   [] History of stroke   [] History of TIA  [] Aphasia   [] Temporary blindness   [] Dysphagia   [] Weakness or numbness in arms   [] Weakness or numbness in legs Musculoskeletal:  [] Arthritis   [] Joint swelling   [] Joint pain   [x] Low back pain Hematologic:  [] Easy bruising  [] Easy bleeding   [] Hypercoagulable state   [] Anemic  [] Hepatitis Gastrointestinal:  [] Blood in stool   [] Vomiting blood  [] Gastroesophageal reflux/heartburn   [] Difficulty swallowing. Genitourinary:  [x] Chronic kidney disease   [] Difficult urination  [] Frequent urination  [] Burning with urination   [] Blood in urine Skin:  [] Rashes   [] Ulcers   [] Wounds Psychological:  [] History of anxiety   []  History of major depression.  Physical Examination  Vitals:   06/28/21 1101 06/28/21 1102  BP: 130/77 137/78  Pulse: 65   Resp: 16   Weight: 175 lb 9.6 oz (79.7 kg)    Body mass index is 32.12 kg/m. Gen:  WD/WN, NAD. Appears younger than stated age. Head: [place]/AT, No temporalis wasting. Ear/Nose/Throat: Hearing grossly intact, nares w/o erythema or drainage, trachea midline Eyes: Conjunctiva clear. Sclera non-icteric Neck: Supple.  No bruit  Pulmonary:  Good air movement, equal and clear to auscultation bilaterally.  Cardiac: RRR, No JVD Vascular:  Vessel Right Left  Radial Palpable Palpable       Musculoskeletal: M/S 5/5 throughout.  No deformity or atrophy. No edema. Neurologic: CN 2-12 intact. Sensation grossly intact in extremities.  Symmetrical.  Speech is fluent. Motor exam as listed above. Psychiatric: Judgment intact, Mood & affect appropriate for pt's clinical situation. Dermatologic: No rashes or ulcers noted.  No  cellulitis or open wounds.     CBC No results found for: WBC, HGB, HCT, MCV, PLT  BMET    Component Value Date/Time   K 3.8 09/09/2012 1711   CrCl cannot be calculated (No successful lab value found.).  COAG No results found for: INR, PROTIME  Radiology No results found.   Assessment/Plan Carotid stenosis  Carotid duplex today reveals stable 1 to 39% ICA stenosis bilaterally.  No role for intervention.  On a statin agent.  Discussed potentially adding a baby aspirin daily or every other day to help slow progression as well.  Recheck in 1 year.  Benign essential hypertension blood pressure control important in reducing the progression of atherosclerotic disease. On appropriate oral medications.   Diabetes type 2, controlled ([place]) blood glucose control important in reducing the progression of atherosclerotic disease. Also, involved in wound healing. On appropriate medications.   Pure hypercholesterolemia lipid control important in reducing the progression of atherosclerotic disease. Continue statin therapy    Leotis Pain, MD  06/28/2021 12:30 PM    This note was created with Dragon medical transcription system.  Any errors from dictation are purely unintentional

## 2021-09-12 ENCOUNTER — Other Ambulatory Visit: Payer: Self-pay | Admitting: Internal Medicine

## 2021-09-13 ENCOUNTER — Other Ambulatory Visit: Payer: Self-pay | Admitting: Internal Medicine

## 2021-09-14 ENCOUNTER — Other Ambulatory Visit: Payer: Self-pay | Admitting: Internal Medicine

## 2021-09-14 DIAGNOSIS — N644 Mastodynia: Secondary | ICD-10-CM

## 2021-09-15 ENCOUNTER — Other Ambulatory Visit: Payer: Self-pay | Admitting: Internal Medicine

## 2021-09-15 DIAGNOSIS — N644 Mastodynia: Secondary | ICD-10-CM

## 2021-09-26 ENCOUNTER — Other Ambulatory Visit: Payer: Self-pay

## 2021-09-26 ENCOUNTER — Ambulatory Visit
Admission: RE | Admit: 2021-09-26 | Discharge: 2021-09-26 | Disposition: A | Discharge status: Home / Self Care | Payer: Medicare Other | Source: Ambulatory Visit | Attending: Internal Medicine | Admitting: Internal Medicine

## 2021-09-26 DIAGNOSIS — N644 Mastodynia: Secondary | ICD-10-CM | POA: Insufficient documentation

## 2021-10-11 ENCOUNTER — Other Ambulatory Visit: Payer: Self-pay | Admitting: Internal Medicine

## 2021-10-11 DIAGNOSIS — R1013 Epigastric pain: Secondary | ICD-10-CM

## 2021-10-11 DIAGNOSIS — R1084 Generalized abdominal pain: Secondary | ICD-10-CM

## 2021-10-21 ENCOUNTER — Other Ambulatory Visit: Payer: Self-pay

## 2021-10-21 ENCOUNTER — Ambulatory Visit
Admission: RE | Admit: 2021-10-21 | Discharge: 2021-10-21 | Disposition: A | Discharge status: Home / Self Care | Payer: Medicare Other | Source: Ambulatory Visit | Attending: Internal Medicine | Admitting: Internal Medicine

## 2021-10-21 DIAGNOSIS — R1084 Generalized abdominal pain: Secondary | ICD-10-CM | POA: Insufficient documentation

## 2021-10-21 DIAGNOSIS — R1013 Epigastric pain: Secondary | ICD-10-CM | POA: Diagnosis present

## 2021-11-28 ENCOUNTER — Ambulatory Visit: Payer: Medicare Other | Admitting: Obstetrics and Gynecology

## 2021-11-30 ENCOUNTER — Encounter: Payer: Self-pay | Admitting: Obstetrics and Gynecology

## 2021-11-30 ENCOUNTER — Ambulatory Visit (INDEPENDENT_AMBULATORY_CARE_PROVIDER_SITE_OTHER): Payer: Medicare Other | Admitting: Obstetrics and Gynecology

## 2021-11-30 ENCOUNTER — Other Ambulatory Visit (HOSPITAL_COMMUNITY)
Admission: RE | Admit: 2021-11-30 | Discharge: 2021-11-30 | Disposition: A | Discharge status: Home / Self Care | Payer: Medicare Other | Source: Ambulatory Visit | Attending: Obstetrics and Gynecology | Admitting: Obstetrics and Gynecology

## 2021-11-30 ENCOUNTER — Other Ambulatory Visit: Payer: Self-pay

## 2021-11-30 VITALS — BP 150/84 | Ht 62.0 in | Wt 175.0 lb

## 2021-11-30 DIAGNOSIS — Z1151 Encounter for screening for human papillomavirus (HPV): Secondary | ICD-10-CM | POA: Diagnosis not present

## 2021-11-30 DIAGNOSIS — I6523 Occlusion and stenosis of bilateral carotid arteries: Secondary | ICD-10-CM

## 2021-11-30 DIAGNOSIS — Z1239 Encounter for other screening for malignant neoplasm of breast: Secondary | ICD-10-CM

## 2021-11-30 DIAGNOSIS — Z01419 Encounter for gynecological examination (general) (routine) without abnormal findings: Secondary | ICD-10-CM | POA: Insufficient documentation

## 2021-11-30 DIAGNOSIS — Z124 Encounter for screening for malignant neoplasm of cervix: Secondary | ICD-10-CM

## 2021-11-30 MED ORDER — TERCONAZOLE 0.4 % VA CREA: TOPICAL_CREAM | VAGINAL | 2 refills | Status: AC

## 2021-11-30 NOTE — Patient Instructions (Signed)
[facility]

## 2021-12-07 LAB — CYTOLOGY - PAP
Adequacy: ABSENT
Comment: NEGATIVE
Diagnosis: NEGATIVE
High risk HPV: NEGATIVE

## 2021-12-08 NOTE — Progress Notes (Signed)
Gynecology Exam  PCP: Idelle Crouch, MD  Chief Complaint:  Chief Complaint  Patient presents with   Gynecologic Exam    Annual - no concerns. RM 3    History of Present Illness:Patient is a 70 y.o. [id number] presents for annual exam. The patient has no complaints today.   LMP: No LMP recorded. Patient is postmenopausal. No postmenopausal bleeding  The patient does perform self breast exams.  There is no notable family history of breast or ovarian cancer in her family.  The patient wears seatbelts: yes.   The patient has regular exercise: not asked.    The patient denies current symptoms of depression.     Review of Systems: Review of Systems  Constitutional: Negative.   Cardiovascular: Negative.   Gastrointestinal: Negative.   Genitourinary: Negative.   Psychiatric/Behavioral: Negative.     Past Medical History:  Patient Active Problem List   Diagnosis Date Noted   Carotid stenosis 06/28/2021   Osteopenia after menopause 11/23/2019   Benign essential hypertension 10/07/2019   Chronic kidney disease, stage II (mild) 10/07/2019   Cervical spondylosis 05/30/2019   Nontraumatic complete tear of right rotator cuff 05/30/2019   Rotator cuff tendinitis, left 05/30/2019   Rotator cuff tendinitis, right 05/30/2019   Diabetic nephropathy ([place]) 10/26/2016   Hyperglycemia, unspecified 10/26/2016   Hypertension 10/26/2016    Overview:  benign    Iron deficiency anemia 10/26/2016   Menorrhagia 10/26/2016    Overview:  Chronic    Nephrolithiasis 10/26/2016   Nocturnal leg cramps 10/26/2016   Palpitations 10/26/2016   Renal insufficiency 10/26/2016   Tachycardia 10/26/2016   Generalized OA 03/29/2016   Diabetes type 2, controlled ([employer]) 11/08/2015   H/O adenomatous polyp of colon 10/15/2014   Pure hypercholesterolemia 09/11/2014    Past Surgical History:  Past Surgical History:  Procedure Laterality Date   CARDIAC CATHETERIZATION  2009   CATARACT EXTRACTION  Right October 2013   CATARACT EXTRACTION W/PHACO Left 03/30/2021   Procedure: CATARACT EXTRACTION PHACO AND INTRAOCULAR LENS PLACEMENT ([place]) LEFT DIABETIC;  Surgeon: Leandrew Koyanagi, MD;  Location: [facility];  Service: Ophthalmology;  Laterality: Left;  4.74 1:07.4 7.0%   COLONOSCOPY  2016   normal   COLONOSCOPY WITH PROPOFOL N/A 04/22/2020   Procedure: COLONOSCOPY WITH PROPOFOL;  Surgeon: Jonathon Bellows, MD;  Location: [facility] ENDOSCOPY;  Service: Gastroenterology;  Laterality: N/A;   CYSTOSCOPY WITH BIOPSY N/A 01/02/2017   Procedure: CYSTOSCOPY WITH BIOPSY;  Surgeon: Hollice Espy, MD;  Location: [facility] ORS;  Service: Urology;  Laterality: N/A;   DILATION AND CURETTAGE OF UTERUS  ~2000   bleeding issues; MAD   ENDOMETRIAL BIOPSY  1996/ 2000   HYSTEROSCOPY WITH D & C  01/14/1998   cystic hyperplasia   TONSILLECTOMY     URETEROSCOPY WITH HOLMIUM LASER LITHOTRIPSY      Gynecologic History:  No LMP recorded. Patient is postmenopausal. Last Pap: Results were: 12/19/2020NIL and HR HPV negative  Last mammogram: 09/26/21 Results were: Gillian Shields I  Obstetric History: [id number]  Family History:  Family History  Problem Relation Age of Onset   Diabetes Mother    Hypertension Mother    Diabetes Father    Heart disease Father        coronary artery disease   Hyperlipidemia Father    Diabetes Sister    Hypertension Sister    Hyperlipidemia Sister    Non-Hodgkin's lymphoma Sister 19   Diabetes Brother    Hypertension Brother    Hyperlipidemia Brother  Hypertension Sister    Hyperlipidemia Sister    Hyperlipidemia Sister    Melanoma Daughter 54   Cancer Daughter        rare form of sarcoma on her leg   Bladder Cancer Neg Hx    Prostate cancer Neg Hx    Kidney cancer Neg Hx    Breast cancer Neg Hx     Social History:  Social History   Socioeconomic History   Marital status: Married    Spouse name: Not on file   Number of children: 5   Years of education: 16   Highest  education level: Not on file  Occupational History   Occupation: RN  Tobacco Use   Smoking status: Former    Years: 1.50    Types: Cigarettes    Quit date: 04/19/1972    Years since quitting: 49.6   Smokeless tobacco: Never  Vaping Use   Vaping Use: Never used  Substance and Sexual Activity   Alcohol use: Yes    Alcohol/week: 0.0 standard drinks    Comment: rare   Drug use: No   Sexual activity: Yes    Partners: Male    Birth control/protection: Post-menopausal  Other Topics Concern   Not on file  Social History Narrative   Has 12 grandchildren (2019)   Social Determinants of Radio broadcast assistant Strain: Not on file  Food Insecurity: Not on file  Transportation Needs: Not on file  Physical Activity: Not on file  Stress: Not on file  Social Connections: Not on file  Intimate Partner Violence: Not on file    Allergies:  No Known Allergies  Medications: Prior to Admission medications   Medication Sig Start Date End Date Taking? Authorizing Provider  Alpha-Lipoic Acid 600 MG TABS Take by mouth daily.    [provider]  Ascorbic Acid (VITAMIN C) 1000 MG tablet Take 1,000 mg by mouth 3 (three) times a week.     [provider]  Calcium 500-125 MG-UNIT TABS Take 1 tablet by mouth daily.    [provider]  Cholecalciferol (VITAMIN D3) 2000 units TABS Take 1 tablet by mouth 3 (three) times a week.    [provider]  Coenzyme Q10 (COQ10) 100 MG CAPS Take by mouth daily.    [provider]  Cyanocobalamin (VITAMIN B-12) 5000 MCG TBDP Take 5,000 mcg by mouth 3 (three) times a week.    [provider]  furosemide (LASIX) 20 MG tablet Take 20 mg by mouth daily as needed for fluid.    [provider]  gentamicin ointment (GARAMYCIN) 0.1 %  01/08/20   [provider]  glipiZIDE (GLUCOTROL) 10 MG tablet Take 10 mg by mouth daily before breakfast.    [provider]  glucose blood test strip USE  ONCE DAILY AS DIRECTED 09/17/17   [provider]  Hypromellose (NATURAL BALANCE TEARS OP) Place 1-2 drops into both eyes 3 (three) times daily as needed (for dry/irritated eyes).    [provider]  KRILL OIL PO Take 1 capsule by mouth See admin instructions. 2-3x's a week    [provider]  Lancets (ACCU-CHEK MULTICLIX) lancets USE AS DIRECTED 03/02/15   [provider]  latanoprost (XALATAN) 0.005 % ophthalmic solution Place 1 drop into both eyes at bedtime.    [provider]  Loratadine 10 MG CAPS Take 1 tablet by mouth daily.    [provider]  MAGNESIUM PO Take 400 mg by mouth  daily.    [provider]  metFORMIN (GLUCOPHAGE-XR) 500 MG 24 hr tablet TAKE 2 TABLETS BY MOUTH EVERY DAY 01/30/20   [provider]  metFORMIN (GLUCOPHAGE-XR) 500 MG 24 hr tablet Take 1,000 mg by mouth daily. 01/30/20   [provider]  methocarbamol (ROBAXIN) 750 MG tablet Take 1 tablet by mouth daily. 11/09/19   [provider]  metoprolol succinate (TOPROL-XL) 50 MG 24 hr tablet Take by mouth. 02/01/20   [provider]  olmesartan (BENICAR) 40 MG tablet Take by mouth. 02/05/20   [provider]  pravastatin (PRAVACHOL) 40 MG tablet TAKE 1 TABLET(40 MG) BY MOUTH DAILY 06/10/21   Kris Hartmann, NP  RESVERATROL PO Take 1,450 mg by mouth daily.    [provider]  terconazole (TERAZOL 7) 0.4 % vaginal cream INSERT 1 APPLICATORFUL VAGINALLY EVERY NIGHT AT BEDTIME FOR 7 NIGHTS 11/30/21   Malachy Mood, MD  timolol (TIMOPTIC) 0.5 % ophthalmic solution 1 drop every morning. 06/27/21   [provider]  tobramycin (TOBREX) 0.3 % ophthalmic solution 1 drop every 4 (four) hours. Patient not taking: No sig reported 12/08/19   [provider]  triamcinolone cream (KENALOG) 0.1 % Apply 1 application topically as needed. Patient not taking: No sig reported 08/12/19   [provider]   TURMERIC PO Take by mouth daily.    [provider]    Physical Exam Vitals: Blood pressure (!) 150/84, height 5\' 2"  (1.575 m), weight 175 lb (79.4 kg).  General: NAD HEENT: normocephalic, anicteric Pulmonary: No increased work of breathing, CTAB Cardiovascular: RRR, distal pulses 2+ Breast: Breast symmetrical, no tenderness, no palpable nodules or masses, no skin or nipple retraction present, no nipple discharge.  No axillary or supraclavicular lymphadenopathy. Abdomen: NABS, soft, non-tender, non-distended.  Umbilicus without lesions.  No hepatomegaly, splenomegaly or masses palpable. No evidence of hernia  Genitourinary:  External: Normal external female genitalia.  Normal urethral meatus, normal Bartholin's and Skene's glands.    Vagina: Normal vaginal mucosa, no evidence of prolapse.    Cervix: Grossly normal in appearance, no bleeding  Uterus: Non-enlarged, mobile, normal contour.  No CMT  Adnexa: ovaries non-enlarged, no adnexal masses  Rectal: deferred  Lymphatic: no evidence of inguinal lymphadenopathy Extremities: no edema, erythema, or tenderness Neurologic: Grossly intact Psychiatric: mood appropriate, affect full  Female chaperone present for pelvic and breast  portions of the physical exam     Assessment: 70 y.o. [id number] routine annual exam  Plan: Problem List Items Addressed This Visit   None Visit Diagnoses     Screening for malignant neoplasm of cervix    -  Primary   Relevant Orders   Cytology - PAP (Completed)   Breast screening       Encounter for cervical Pap smear with pelvic exam           1) Mammogram - recommend yearly screening mammogram.  Mammogram Is up to date  2) STI screening  was notoffered and therefore not obtained  3) ASCCP guidelines and rational discussed.  Patient opts for every 3 years screening interval  4) Routine healthcare maintenance including cholesterol, diabetes screening discussed managed by PCP  5) Return  in about 2 years (around 12/01/2023) for pap.    Malachy Mood, MD [facility], [facility]

## 2021-12-12 ENCOUNTER — Encounter: Payer: Self-pay | Admitting: Obstetrics and Gynecology

## 2022-01-03 ENCOUNTER — Ambulatory Visit (INDEPENDENT_AMBULATORY_CARE_PROVIDER_SITE_OTHER): Payer: Medicare Other | Admitting: Vascular Surgery

## 2022-01-03 ENCOUNTER — Encounter (INDEPENDENT_AMBULATORY_CARE_PROVIDER_SITE_OTHER): Payer: Medicare Other

## 2022-05-31 ENCOUNTER — Other Ambulatory Visit (INDEPENDENT_AMBULATORY_CARE_PROVIDER_SITE_OTHER): Payer: Self-pay | Admitting: Nurse Practitioner

## 2022-05-31 DIAGNOSIS — E7800 Pure hypercholesterolemia, unspecified: Secondary | ICD-10-CM

## 2022-06-30 ENCOUNTER — Other Ambulatory Visit (INDEPENDENT_AMBULATORY_CARE_PROVIDER_SITE_OTHER): Payer: Self-pay | Admitting: Vascular Surgery

## 2022-06-30 DIAGNOSIS — I6523 Occlusion and stenosis of bilateral carotid arteries: Secondary | ICD-10-CM

## 2022-07-04 ENCOUNTER — Ambulatory Visit (INDEPENDENT_AMBULATORY_CARE_PROVIDER_SITE_OTHER): Payer: Medicare Other

## 2022-07-04 ENCOUNTER — Ambulatory Visit (INDEPENDENT_AMBULATORY_CARE_PROVIDER_SITE_OTHER): Payer: Medicare Other | Admitting: Vascular Surgery

## 2022-07-04 ENCOUNTER — Encounter (INDEPENDENT_AMBULATORY_CARE_PROVIDER_SITE_OTHER): Payer: Self-pay | Admitting: Vascular Surgery

## 2022-07-04 VITALS — BP 114/70 | HR 64 | Ht 62.0 in | Wt 171.8 lb

## 2022-07-04 DIAGNOSIS — E119 Type 2 diabetes mellitus without complications: Secondary | ICD-10-CM | POA: Diagnosis not present

## 2022-07-04 DIAGNOSIS — E7800 Pure hypercholesterolemia, unspecified: Secondary | ICD-10-CM

## 2022-07-04 DIAGNOSIS — I6523 Occlusion and stenosis of bilateral carotid arteries: Secondary | ICD-10-CM

## 2022-07-04 DIAGNOSIS — I10 Essential (primary) hypertension: Secondary | ICD-10-CM | POA: Diagnosis not present

## 2022-07-04 NOTE — Progress Notes (Signed)
MRN : [id number]  Cheryl Kirk is a 71 y.o. (16-Sep-1951) female who presents with chief complaint of  Chief Complaint  Patient presents with   Carotid  .  History of Present Illness: Patient returns in follow-up of her carotid disease.  She is doing well today without any focal neurologic symptoms or new complaints. Specifically, the patient denies amaurosis fugax, speech or swallowing difficulties, or arm or leg weakness or numbness. Carotid duplex today reveals 1 to 39% right ICA stenosis and stenosis in the upper end of the 1 to 39% range on the left.  No significant progression from her study last year.  Current Outpatient Medications  Medication Sig Dispense Refill   Alpha-Lipoic Acid 600 MG TABS Take by mouth daily.     Ascorbic Acid (VITAMIN C) 1000 MG tablet Take 1,000 mg by mouth 3 (three) times a week.      Calcium 500-125 MG-UNIT TABS Take 1 tablet by mouth daily.     Cholecalciferol (VITAMIN D3) 2000 units TABS Take 1 tablet by mouth 3 (three) times a week.     Coenzyme Q10 (COQ10) 100 MG CAPS Take by mouth daily.     Cyanocobalamin (VITAMIN B-12) 5000 MCG TBDP Take 5,000 mcg by mouth 3 (three) times a week.     furosemide (LASIX) 20 MG tablet Take 20 mg by mouth daily as needed for fluid.     glipiZIDE (GLUCOTROL) 10 MG tablet Take 10 mg by mouth daily before breakfast.     glucose blood test strip USE ONCE DAILY AS DIRECTED     Hypromellose (NATURAL BALANCE TEARS OP) Place 1-2 drops into both eyes 3 (three) times daily as needed (for dry/irritated eyes).     KRILL OIL PO Take 1 capsule by mouth See admin instructions. 2-3x's a week     Lancets (ACCU-CHEK MULTICLIX) lancets USE AS DIRECTED     latanoprost (XALATAN) 0.005 % ophthalmic solution Place 1 drop into both eyes at bedtime.     Loratadine 10 MG CAPS Take 1 tablet by mouth daily.     MAGNESIUM PO Take 400 mg by mouth daily.     metFORMIN (GLUCOPHAGE-XR) 500 MG 24 hr tablet TAKE 2 TABLETS BY MOUTH EVERY DAY      methocarbamol (ROBAXIN) 750 MG tablet Take 1 tablet by mouth daily.     metoprolol succinate (TOPROL-XL) 50 MG 24 hr tablet Take by mouth.     olmesartan (BENICAR) 40 MG tablet Take by mouth.     pravastatin (PRAVACHOL) 40 MG tablet TAKE 1 TABLET(40 MG) BY MOUTH DAILY 90 tablet 3   RESVERATROL PO Take 1,450 mg by mouth daily.     terconazole (TERAZOL 7) 0.4 % vaginal cream INSERT 1 APPLICATORFUL VAGINALLY EVERY NIGHT AT BEDTIME FOR 7 NIGHTS 45 g 2   timolol (TIMOPTIC) 0.5 % ophthalmic solution 1 drop every morning.     triamcinolone cream (KENALOG) 0.1 % Apply 1 application  topically as needed.     TURMERIC PO Take by mouth daily.     gentamicin ointment (GARAMYCIN) 0.1 %      metFORMIN (GLUCOPHAGE-XR) 500 MG 24 hr tablet Take 1,000 mg by mouth daily.     tobramycin (TOBREX) 0.3 % ophthalmic solution 1 drop every 4 (four) hours.     No current facility-administered medications for this visit.    Past Medical History:  Diagnosis Date   Chronic kidney disease 2012   stage 3  Dr, Jabier Mutton   Diabetes mellitus  without complication (HCC)    Fibroids    Heart murmur    needs no follow up   History of degenerative disc disease    spinal stenosis   History of kidney stones    many years ago   Hyperlipidemia    Hypertension    BP and occular   Kidney stones    Menorrhagia    Microscopic hematuria    Obesity    Ocular hypertension     Past Surgical History:  Procedure Laterality Date   CARDIAC CATHETERIZATION  2009   CATARACT EXTRACTION Right October 2013   CATARACT EXTRACTION W/PHACO Left 03/30/2021   Procedure: CATARACT EXTRACTION PHACO AND INTRAOCULAR LENS PLACEMENT ([place]) LEFT DIABETIC;  Surgeon: Leandrew Koyanagi, MD;  Location: [facility];  Service: Ophthalmology;  Laterality: Left;  4.74 1:07.4 7.0%   COLONOSCOPY  2016   normal   COLONOSCOPY WITH PROPOFOL N/A 04/22/2020   Procedure: COLONOSCOPY WITH PROPOFOL;  Surgeon: Jonathon Bellows, MD;  Location[facility] ENDOSCOPY;   Service: Gastroenterology;  Laterality: N/A;   CYSTOSCOPY WITH BIOPSY N/A 01/02/2017   Procedure: CYSTOSCOPY WITH BIOPSY;  Surgeon: Hollice Espy, MD;  Location: [facility] ORS;  Service: Urology;  Laterality: N/A;   DILATION AND CURETTAGE OF UTERUS  ~2000   bleeding issues; MAD   ENDOMETRIAL BIOPSY  1996/ 2000   HYSTEROSCOPY WITH D & C  01/14/1998   cystic hyperplasia   TONSILLECTOMY     URETEROSCOPY WITH HOLMIUM LASER LITHOTRIPSY       Social History   Tobacco Use   Smoking status: Former    Years: 1.50    Types: Cigarettes    Quit date: 04/19/1972    Years since quitting: 50.2   Smokeless tobacco: Never  Vaping Use   Vaping Use: Never used  Substance Use Topics   Alcohol use: Yes    Alcohol/week: 0.0 standard drinks of alcohol    Comment: rare   Drug use: No      Family History  Problem Relation Age of Onset   Diabetes Mother    Hypertension Mother    Diabetes Father    Heart disease Father        coronary artery disease   Hyperlipidemia Father    Diabetes Sister    Hypertension Sister    Hyperlipidemia Sister    Non-Hodgkin's lymphoma Sister 48   Diabetes Brother    Hypertension Brother    Hyperlipidemia Brother    Hypertension Sister    Hyperlipidemia Sister    Hyperlipidemia Sister    Melanoma Daughter 54   Cancer Daughter        rare form of sarcoma on her leg   Bladder Cancer Neg Hx    Prostate cancer Neg Hx    Kidney cancer Neg Hx    Breast cancer Neg Hx     No Known Allergies   REVIEW OF SYSTEMS (Negative unless checked)  Constitutional: '[]'$ Weight loss  '[]'$ Fever  '[]'$ Chills Cardiac: '[]'$ Chest pain   '[]'$ Chest pressure   '[]'$ Palpitations   '[]'$ Shortness of breath when laying flat   '[]'$ Shortness of breath at rest   '[]'$ Shortness of breath with exertion. Vascular:  '[]'$ Pain in legs with walking   '[]'$ Pain in legs at rest   '[]'$ Pain in legs when laying flat   '[]'$ Claudication   '[]'$ Pain in feet when walking  '[]'$ Pain in feet at rest  '[]'$ Pain in feet when laying flat   '[]'$ History  of DVT   '[]'$ Phlebitis   '[]'$ Swelling in legs   '[]'$   Varicose veins   '[]'$ Non-healing ulcers Pulmonary:   '[]'$ Uses home oxygen   '[]'$ Productive cough   '[]'$ Hemoptysis   '[]'$ Wheeze  '[]'$ COPD   '[]'$ Asthma Neurologic:  '[]'$ Dizziness  '[]'$ Blackouts   '[]'$ Seizures   '[]'$ History of stroke   '[]'$ History of TIA  '[]'$ Aphasia   '[]'$ Temporary blindness   '[]'$ Dysphagia   '[]'$ Weakness or numbness in arms   '[]'$ Weakness or numbness in legs Musculoskeletal:  '[]'$ Arthritis   '[]'$ Joint swelling   '[]'$ Joint pain   '[x]'$ Low back pain Hematologic:  '[]'$ Easy bruising  '[]'$ Easy bleeding   '[]'$ Hypercoagulable state   '[]'$ Anemic  '[]'$ Hepatitis Gastrointestinal:  '[]'$ Blood in stool   '[]'$ Vomiting blood  '[]'$ Gastroesophageal reflux/heartburn   '[]'$ Difficulty swallowing. Genitourinary:  '[x]'$ Chronic kidney disease   '[]'$ Difficult urination  '[]'$ Frequent urination  '[]'$ Burning with urination   '[]'$ Blood in urine Skin:  '[]'$ Rashes   '[]'$ Ulcers   '[]'$ Wounds Psychological:  '[]'$ History of anxiety   '[]'$  History of major depression.  Physical Examination  Vitals:   07/04/22 1049  BP: 114/70  Pulse: 64  Weight: 171 lb 12.8 oz (77.9 kg)  Height: '5\' 2"'$  (1.575 m)   Body mass index is 31.42 kg/m. Gen:  WD/WN, NAD.  Appears younger than stated age Head: [place]/AT, No temporalis wasting. Ear/Nose/Throat: Hearing grossly intact, nares w/o erythema or drainage, trachea midline Eyes: Conjunctiva clear. Sclera non-icteric Neck: Supple.  No bruit  Pulmonary:  Good air movement, equal and clear to auscultation bilaterally.  Cardiac: RRR, No JVD Vascular:  Vessel Right Left  Radial Palpable Palpable       Musculoskeletal: M/S 5/5 throughout.  No deformity or atrophy.  No edema. Neurologic: CN 2-12 intact. Sensation grossly intact in extremities.  Symmetrical.  Speech is fluent. Motor exam as listed above. Psychiatric: Judgment intact, Mood & affect appropriate for pt's clinical situation. Dermatologic: No rashes or ulcers noted.  No cellulitis or open wounds.     CBC No results found for: "WBC", "HGB", "HCT",  "MCV", "PLT"  BMET    Component Value Date/Time   K 3.8 09/09/2012 1711   CrCl cannot be calculated (No successful lab value found.).  COAG No results found for: "INR", "PROTIME"  Radiology No results found.   Assessment/Plan Benign essential hypertension blood pressure control important in reducing the progression of atherosclerotic disease. On appropriate oral medications.     Diabetes type 2, controlled ([place]) blood glucose control important in reducing the progression of atherosclerotic disease. Also, involved in wound healing. On appropriate medications.     Pure hypercholesterolemia lipid control important in reducing the progression of atherosclerotic disease. Continue statin therapy  Carotid stenosis Carotid duplex today reveals 1 to 39% right ICA stenosis and stenosis in the upper end of the 1 to 39% range on the left.  No significant progression from her study last year.  No change in medical regimen.  Recheck in 1 year.    Leotis Pain, MD  07/04/2022 11:43 AM    This note was created with Dragon medical transcription system.  Any errors from dictation are purely unintentional

## 2022-07-04 NOTE — Assessment & Plan Note (Signed)
Carotid duplex today reveals 1 to 39% right ICA stenosis and stenosis in the upper end of the 1 to 39% range on the left.  No significant progression from her study last year.  No change in medical regimen.  Recheck in 1 year.

## 2022-08-24 ENCOUNTER — Other Ambulatory Visit: Payer: Self-pay | Admitting: Internal Medicine

## 2022-08-24 DIAGNOSIS — Z1231 Encounter for screening mammogram for malignant neoplasm of breast: Secondary | ICD-10-CM

## 2022-09-27 ENCOUNTER — Ambulatory Visit
Admission: RE | Admit: 2022-09-27 | Discharge: 2022-09-27 | Disposition: A | Discharge status: Home / Self Care | Payer: Medicare Other | Source: Ambulatory Visit | Attending: Internal Medicine | Admitting: Internal Medicine

## 2022-09-27 DIAGNOSIS — Z1231 Encounter for screening mammogram for malignant neoplasm of breast: Secondary | ICD-10-CM | POA: Diagnosis present

## 2022-10-09 ENCOUNTER — Encounter (INDEPENDENT_AMBULATORY_CARE_PROVIDER_SITE_OTHER): Payer: Self-pay

## 2023-05-15 ENCOUNTER — Other Ambulatory Visit: Payer: Self-pay | Admitting: Internal Medicine

## 2023-05-15 DIAGNOSIS — Z1231 Encounter for screening mammogram for malignant neoplasm of breast: Secondary | ICD-10-CM

## 2023-07-03 ENCOUNTER — Ambulatory Visit (INDEPENDENT_AMBULATORY_CARE_PROVIDER_SITE_OTHER): Payer: Medicare Other | Admitting: Vascular Surgery

## 2023-07-03 ENCOUNTER — Ambulatory Visit (INDEPENDENT_AMBULATORY_CARE_PROVIDER_SITE_OTHER): Payer: Medicare Other

## 2023-07-03 DIAGNOSIS — I6523 Occlusion and stenosis of bilateral carotid arteries: Secondary | ICD-10-CM

## 2023-10-02 ENCOUNTER — Ambulatory Visit
Admission: RE | Admit: 2023-10-02 | Discharge: 2023-10-02 | Disposition: A | Discharge status: Home / Self Care | Payer: Medicare Other | Source: Ambulatory Visit | Attending: Internal Medicine | Admitting: Internal Medicine

## 2023-10-02 DIAGNOSIS — Z1231 Encounter for screening mammogram for malignant neoplasm of breast: Secondary | ICD-10-CM | POA: Insufficient documentation

## 2024-04-29 ENCOUNTER — Encounter (INDEPENDENT_AMBULATORY_CARE_PROVIDER_SITE_OTHER): Payer: Self-pay

## 2024-05-30 ENCOUNTER — Encounter: Payer: Self-pay | Admitting: Internal Medicine

## 2024-05-30 DIAGNOSIS — N939 Abnormal uterine and vaginal bleeding, unspecified: Secondary | ICD-10-CM

## 2024-05-30 DIAGNOSIS — M5440 Lumbago with sciatica, unspecified side: Secondary | ICD-10-CM

## 2024-05-30 DIAGNOSIS — I10 Essential (primary) hypertension: Secondary | ICD-10-CM

## 2024-06-11 ENCOUNTER — Encounter: Payer: Self-pay | Admitting: Internal Medicine

## 2024-06-11 DIAGNOSIS — N939 Abnormal uterine and vaginal bleeding, unspecified: Secondary | ICD-10-CM

## 2024-06-11 DIAGNOSIS — M5440 Lumbago with sciatica, unspecified side: Secondary | ICD-10-CM

## 2024-06-11 DIAGNOSIS — I10 Essential (primary) hypertension: Secondary | ICD-10-CM

## 2024-08-14 ENCOUNTER — Other Ambulatory Visit: Payer: Self-pay | Admitting: Internal Medicine

## 2024-08-14 DIAGNOSIS — Z1231 Encounter for screening mammogram for malignant neoplasm of breast: Secondary | ICD-10-CM

## 2024-10-02 ENCOUNTER — Ambulatory Visit
Admission: RE | Admit: 2024-10-02 | Discharge: 2024-10-02 | Disposition: A | Discharge status: Home / Self Care | Source: Ambulatory Visit | Attending: Internal Medicine | Admitting: Internal Medicine

## 2024-10-02 DIAGNOSIS — Z1231 Encounter for screening mammogram for malignant neoplasm of breast: Secondary | ICD-10-CM | POA: Insufficient documentation
